# Patient Record
Sex: Female | Born: 1975 | Race: White | Hispanic: No | Marital: Married | Smoking: Never smoker
Health system: Southern US, Community
[De-identification: ages and names within clinical notes are randomized; demographics above are authoritative.]

## PROBLEM LIST (undated history)

## (undated) DIAGNOSIS — D1803 Hemangioma of intra-abdominal structures: Secondary | ICD-10-CM

## (undated) DIAGNOSIS — G43909 Migraine, unspecified, not intractable, without status migrainosus: Secondary | ICD-10-CM

## (undated) DIAGNOSIS — Q0700 Arnold-Chiari syndrome without spina bifida or hydrocephalus: Secondary | ICD-10-CM

## (undated) DIAGNOSIS — I341 Nonrheumatic mitral (valve) prolapse: Secondary | ICD-10-CM

## (undated) DIAGNOSIS — J45909 Unspecified asthma, uncomplicated: Secondary | ICD-10-CM

## (undated) HISTORY — DX: Migraine, unspecified, not intractable, without status migrainosus: G43.909

## (undated) HISTORY — DX: Nonrheumatic mitral (valve) prolapse: I34.1

## (undated) HISTORY — DX: Arnold-Chiari syndrome without spina bifida or hydrocephalus: Q07.00

## (undated) HISTORY — DX: Hemangioma of intra-abdominal structures: D18.03

## (undated) HISTORY — DX: Unspecified asthma, uncomplicated: J45.909

---

## 1998-01-14 ENCOUNTER — Encounter: Admission: RE | Admit: 1998-01-14 | Discharge: 1998-01-14 | Payer: Self-pay | Admitting: *Deleted

## 1998-02-24 ENCOUNTER — Ambulatory Visit (HOSPITAL_COMMUNITY): Admission: RE | Admit: 1998-02-24 | Discharge: 1998-02-24 | Payer: Self-pay | Admitting: *Deleted

## 1998-04-02 ENCOUNTER — Ambulatory Visit (HOSPITAL_COMMUNITY): Admission: RE | Admit: 1998-04-02 | Discharge: 1998-04-02 | Payer: Self-pay | Admitting: *Deleted

## 1998-04-02 ENCOUNTER — Encounter: Payer: Self-pay | Admitting: *Deleted

## 1998-07-03 ENCOUNTER — Inpatient Hospital Stay (HOSPITAL_COMMUNITY): Admission: AD | Admit: 1998-07-03 | Discharge: 1998-07-05 | Payer: Self-pay | Admitting: *Deleted

## 1998-08-13 ENCOUNTER — Inpatient Hospital Stay (HOSPITAL_COMMUNITY): Admission: AD | Admit: 1998-08-13 | Discharge: 1998-08-13 | Payer: Self-pay | Admitting: *Deleted

## 1998-11-11 ENCOUNTER — Inpatient Hospital Stay (HOSPITAL_COMMUNITY): Admission: AD | Admit: 1998-11-11 | Discharge: 1998-11-11 | Payer: Self-pay | Admitting: *Deleted

## 1999-02-05 ENCOUNTER — Inpatient Hospital Stay (HOSPITAL_COMMUNITY): Admission: AD | Admit: 1999-02-05 | Discharge: 1999-02-05 | Payer: Self-pay | Admitting: *Deleted

## 1999-04-30 ENCOUNTER — Inpatient Hospital Stay (HOSPITAL_COMMUNITY): Admission: AD | Admit: 1999-04-30 | Discharge: 1999-04-30 | Payer: Self-pay | Admitting: *Deleted

## 2004-04-28 ENCOUNTER — Emergency Department (HOSPITAL_COMMUNITY): Admission: EM | Admit: 2004-04-28 | Discharge: 2004-04-28 | Payer: Self-pay | Admitting: Family Medicine

## 2004-06-13 ENCOUNTER — Encounter (INDEPENDENT_AMBULATORY_CARE_PROVIDER_SITE_OTHER): Payer: Self-pay | Admitting: Internal Medicine

## 2004-08-10 ENCOUNTER — Ambulatory Visit (HOSPITAL_COMMUNITY): Admission: RE | Admit: 2004-08-10 | Discharge: 2004-08-10 | Payer: Self-pay | Admitting: Internal Medicine

## 2004-08-10 ENCOUNTER — Ambulatory Visit: Payer: Self-pay | Admitting: Internal Medicine

## 2004-08-17 ENCOUNTER — Ambulatory Visit: Payer: Self-pay | Admitting: Internal Medicine

## 2004-08-18 ENCOUNTER — Ambulatory Visit (HOSPITAL_COMMUNITY): Admission: RE | Admit: 2004-08-18 | Discharge: 2004-08-18 | Payer: Self-pay | Admitting: Internal Medicine

## 2004-08-23 ENCOUNTER — Ambulatory Visit: Payer: Self-pay | Admitting: Internal Medicine

## 2005-09-01 ENCOUNTER — Ambulatory Visit: Payer: Self-pay | Admitting: Internal Medicine

## 2005-09-29 ENCOUNTER — Ambulatory Visit: Payer: Self-pay | Admitting: Internal Medicine

## 2005-11-09 ENCOUNTER — Ambulatory Visit (HOSPITAL_COMMUNITY): Admission: RE | Admit: 2005-11-09 | Discharge: 2005-11-09 | Payer: Self-pay | Admitting: Internal Medicine

## 2005-11-11 ENCOUNTER — Ambulatory Visit (HOSPITAL_COMMUNITY): Admission: RE | Admit: 2005-11-11 | Discharge: 2005-11-11 | Payer: Self-pay | Admitting: Internal Medicine

## 2005-11-14 ENCOUNTER — Other Ambulatory Visit: Admission: RE | Admit: 2005-11-14 | Discharge: 2005-11-14 | Payer: Self-pay | Admitting: Gynecology

## 2005-12-15 ENCOUNTER — Ambulatory Visit: Payer: Self-pay | Admitting: Internal Medicine

## 2006-11-21 ENCOUNTER — Ambulatory Visit: Payer: Self-pay | Admitting: Family Medicine

## 2007-01-25 ENCOUNTER — Encounter: Payer: Self-pay | Admitting: Internal Medicine

## 2007-01-25 DIAGNOSIS — R519 Headache, unspecified: Secondary | ICD-10-CM | POA: Insufficient documentation

## 2007-01-25 DIAGNOSIS — Q054 Unspecified spina bifida with hydrocephalus: Secondary | ICD-10-CM | POA: Insufficient documentation

## 2007-01-25 DIAGNOSIS — R51 Headache: Secondary | ICD-10-CM

## 2007-02-12 DIAGNOSIS — F411 Generalized anxiety disorder: Secondary | ICD-10-CM | POA: Insufficient documentation

## 2007-03-05 ENCOUNTER — Encounter (INDEPENDENT_AMBULATORY_CARE_PROVIDER_SITE_OTHER): Payer: Self-pay | Admitting: Family Medicine

## 2007-03-05 ENCOUNTER — Ambulatory Visit: Payer: Self-pay | Admitting: Family Medicine

## 2007-03-05 DIAGNOSIS — R8789 Other abnormal findings in specimens from female genital organs: Secondary | ICD-10-CM | POA: Insufficient documentation

## 2007-03-05 LAB — CONVERTED CEMR LAB
ALT: 13 units/L (ref 0–35)
Alkaline Phosphatase: 56 units/L (ref 39–117)
CO2: 24 meq/L (ref 19–32)
Calcium: 8.9 mg/dL (ref 8.4–10.5)
Cholesterol: 158 mg/dL (ref 0–200)
Creatinine, Ser: 0.69 mg/dL (ref 0.40–1.20)
Potassium: 4.3 meq/L (ref 3.5–5.3)
Total Bilirubin: 0.3 mg/dL (ref 0.3–1.2)
Triglycerides: 140 mg/dL (ref <150)

## 2007-05-23 ENCOUNTER — Other Ambulatory Visit: Admission: RE | Admit: 2007-05-23 | Discharge: 2007-05-23 | Payer: Self-pay | Admitting: Obstetrics and Gynecology

## 2007-05-23 ENCOUNTER — Ambulatory Visit: Payer: Self-pay | Admitting: Gynecology

## 2007-05-23 ENCOUNTER — Encounter (INDEPENDENT_AMBULATORY_CARE_PROVIDER_SITE_OTHER): Payer: Self-pay | Admitting: Family Medicine

## 2007-06-22 ENCOUNTER — Ambulatory Visit: Payer: Self-pay | Admitting: Obstetrics & Gynecology

## 2008-03-05 ENCOUNTER — Ambulatory Visit: Payer: Self-pay | Admitting: Family Medicine

## 2008-03-27 ENCOUNTER — Telehealth (INDEPENDENT_AMBULATORY_CARE_PROVIDER_SITE_OTHER): Payer: Self-pay | Admitting: Family Medicine

## 2008-04-15 ENCOUNTER — Encounter (INDEPENDENT_AMBULATORY_CARE_PROVIDER_SITE_OTHER): Payer: Self-pay | Admitting: *Deleted

## 2008-09-06 ENCOUNTER — Inpatient Hospital Stay (HOSPITAL_COMMUNITY): Admission: AD | Admit: 2008-09-06 | Discharge: 2008-09-06 | Payer: Self-pay | Admitting: Obstetrics and Gynecology

## 2008-10-24 ENCOUNTER — Inpatient Hospital Stay (HOSPITAL_COMMUNITY): Admission: RE | Admit: 2008-10-24 | Discharge: 2008-10-26 | Payer: Self-pay | Admitting: Obstetrics and Gynecology

## 2008-10-28 ENCOUNTER — Inpatient Hospital Stay (HOSPITAL_COMMUNITY): Admission: AD | Admit: 2008-10-28 | Discharge: 2008-10-29 | Payer: Self-pay | Admitting: Obstetrics and Gynecology

## 2009-07-13 ENCOUNTER — Emergency Department (HOSPITAL_COMMUNITY): Admission: EM | Admit: 2009-07-13 | Discharge: 2009-07-13 | Payer: Self-pay | Admitting: Family Medicine

## 2009-08-18 ENCOUNTER — Ambulatory Visit: Payer: Self-pay | Admitting: Nurse Practitioner

## 2009-08-18 DIAGNOSIS — N63 Unspecified lump in unspecified breast: Secondary | ICD-10-CM

## 2009-08-20 ENCOUNTER — Encounter: Admission: RE | Admit: 2009-08-20 | Discharge: 2009-08-20 | Payer: Self-pay | Admitting: Internal Medicine

## 2010-06-13 DIAGNOSIS — I341 Nonrheumatic mitral (valve) prolapse: Secondary | ICD-10-CM

## 2010-06-13 HISTORY — DX: Nonrheumatic mitral (valve) prolapse: I34.1

## 2010-06-15 ENCOUNTER — Emergency Department (HOSPITAL_COMMUNITY)
Admission: EM | Admit: 2010-06-15 | Discharge: 2010-06-15 | Payer: Self-pay | Source: Home / Self Care | Admitting: Family Medicine

## 2010-07-13 NOTE — Assessment & Plan Note (Signed)
Summary: Asthma/Breast Lump   Vital Signs:  Patient profile:   35 year old female Menstrual status:  regular LMP:     02/11 Weight:      144.7 pounds BMI:     21.14 O2 Sat:      99 % on Room air Temp:     98 degrees F oral Pulse rate:   86 / minute Pulse rhythm:   regular Resp:     24 per minute BP sitting:   118 / 84  (left arm) Cuff size:   regular  Vitals Entered By: Gaylyn Cheers RN (August 18, 2009 12:47 PM)  O2 Flow:  Room air CC: lump lt breast x's 6 months, SOB when lying down with cough, expectorating lg amt. cl mucous Is Patient Diabetic? No Pain Assessment Patient in pain? no       Does patient need assistance? Functional Status Self care Ambulation Normal Comments takes multi vit. and albuterol MDI prn LMP (date): 02/11 LMP - Character: light Menarche (age onset years): 14   Menses interval (days): ? Menstrual flow (days): 4 Menstrual Status regular Enter LMP: 02/11 Last PAP Result Done   Serial Vital Signs/Assessments:                                PEF    PreRx  PostRx Time      O2 Sat  O2 Type     L/min  L/min  L/min   By 1:00 PM   99  %   Room air                          [facility] RN  Comments: peak flow 230, 220, 270 By: Gaylyn Cheers RN    CC:  lump lt breast x's 6 months, SOB when lying down with cough, and expectorating lg amt. cl mucous.  History of Present Illness:  Pt into the office for f/u for breast problems Noticed 6 months ago. Area is not getting larger some discomfort and tenderness Normal nipple. no discharge.  breast fed baby for first month.  Baby is now 28 months old Normal menses every month Denies regular caffiene   Maternal grandmother - breast cancer and several maternal aunts with breast cancer mother is not affected.  Pt has no sisters  Asthma -  cough and wheezing mostly at night when she lays down. Seen in urgent care for bronchitis in feb 2011   Habits & Providers  Alcohol-Tobacco-Diet  Alcohol drinks/day: 0     Tobacco Status: never     Passive Smoke Exposure: no  Exercise-Depression-Behavior     Does Patient Exercise: no     Drug Use: no     Seat Belt Use: 100     Sun Exposure: occasionally  Allergies (verified): No Known Drug Allergies  Review of Systems General:  Denies fever. CV:  Denies chest pain or discomfort. Resp:  Complains of shortness of breath and wheezing; denies cough; usually at night. GI:  Denies abdominal pain, nausea, and vomiting.  Physical Exam  General:  alert.   Head:  normocephalic.   Breasts:  left - upper outer quadrant, tender mobile mass nipple everted right - no masses palpated Lungs:  normal breath sounds.   Heart:  normal rate and regular rhythm.     Impression & Recommendations:  Problem # 1:  LUMP OR MASS IN  BREAST (ICD-611.72) will order mammogram Orders: Mammogram (Diagnostic) (Mammo)  Problem # 2:  ASTHMA (ICD-493.90)  Her updated medication list for this problem includes:    Singulair 10 Mg Tabs (Montelukast sodium) ..... One tablet by mouth nightly for breathing    Ventolin Hfa 108 (90 Base) Mcg/act Aers (Albuterol sulfate) .Marland Kitchen... 2 puffs every 6 hours as needed for shortness of breath  Orders: Pulse Oximetry (single measurment) (94760) Peak Flow Meter (Z6109)  Complete Medication List: 1)  Singulair 10 Mg Tabs (Montelukast sodium) .... One tablet by mouth nightly for breathing 2)  Ventolin Hfa 108 (90 Base) Mcg/act Aers (Albuterol sulfate) .... 2 puffs every 6 hours as needed for shortness of breath  Patient Instructions: 1)  Keep your appointment for mammogram 2)  Breathing - take singulair 10mg  by mouth nightly (prescription has been faxed to the pharmacy) 3)  Schedule an appointment for a complete physical exam. Prescriptions: VENTOLIN HFA 108 (90 BASE) MCG/ACT AERS (ALBUTEROL SULFATE) 2 puffs every 6 hours as needed for shortness of breath  #1 x 3   Entered and Authorized by:   Lehman Prom FNP    Signed by:   Lehman Prom FNP on 08/18/2009   Method used:   Faxed to ...       [facility] (retail)       [address] [place], [place]  [address]       Ph: [phone]       Fax: [phone]   RxID:   [id number] SINGULAIR 10 MG TABS (MONTELUKAST SODIUM) One tablet by mouth nightly for breathing  #30 x 5   Entered and Authorized by:   Lehman Prom FNP   Signed by:   Lehman Prom FNP on 08/18/2009   Method used:   Faxed to ...       [facility] (retail)       [address] [place], [place]  [address]       Ph: [phone]       Fax: [phone]   RxID:   [id number]

## 2010-09-21 LAB — DIFFERENTIAL
Basophils Absolute: 0 10*3/uL (ref 0.0–0.1)
Basophils Absolute: 0.1 10*3/uL (ref 0.0–0.1)
Basophils Relative: 0 % (ref 0–1)
Eosinophils Absolute: 0.9 10*3/uL — ABNORMAL HIGH (ref 0.0–0.7)
Eosinophils Absolute: 1.1 10*3/uL — ABNORMAL HIGH (ref 0.0–0.7)
Eosinophils Relative: 8 % — ABNORMAL HIGH (ref 0–5)
Lymphs Abs: 1.6 10*3/uL (ref 0.7–4.0)
Monocytes Absolute: 0.9 10*3/uL (ref 0.1–1.0)
Monocytes Absolute: 1.3 10*3/uL — ABNORMAL HIGH (ref 0.1–1.0)
Monocytes Relative: 7 % (ref 3–12)
Neutro Abs: 7.7 10*3/uL (ref 1.7–7.7)
Neutrophils Relative %: 75 % (ref 43–77)

## 2010-09-21 LAB — CBC
HCT: 31.4 % — ABNORMAL LOW (ref 36.0–46.0)
HCT: 33.3 % — ABNORMAL LOW (ref 36.0–46.0)
Hemoglobin: 11.5 g/dL — ABNORMAL LOW (ref 12.0–15.0)
Hemoglobin: 10.8 g/dL — ABNORMAL LOW (ref 12.0–15.0)
MCHC: 34.5 g/dL (ref 30.0–36.0)
MCHC: 35.9 g/dL (ref 30.0–36.0)
MCV: 89.9 fL (ref 78.0–100.0)
MCV: 89.2 fL (ref 78.0–100.0)
Platelets: 222 10*3/uL (ref 150–400)
Platelets: 222 10*3/uL (ref 150–400)
RBC: 3.25 MIL/uL — ABNORMAL LOW (ref 3.87–5.11)
RBC: 3.93 MIL/uL (ref 3.87–5.11)
RDW: 14.4 % (ref 11.5–15.5)
RDW: 14.2 % (ref 11.5–15.5)
WBC: 11.2 10*3/uL — ABNORMAL HIGH (ref 4.0–10.5)
WBC: 11.2 10*3/uL — ABNORMAL HIGH (ref 4.0–10.5)

## 2010-09-21 LAB — URINALYSIS, ROUTINE W REFLEX MICROSCOPIC
Glucose, UA: NEGATIVE mg/dL
Ketones, ur: NEGATIVE mg/dL
Nitrite: NEGATIVE
pH: 6 (ref 5.0–8.0)

## 2010-09-21 LAB — URINE MICROSCOPIC-ADD ON

## 2010-09-21 LAB — LACTATE DEHYDROGENASE: LDH: 253 U/L — ABNORMAL HIGH (ref 94–250)

## 2010-09-23 LAB — WET PREP, GENITAL: Yeast Wet Prep HPF POC: NONE SEEN

## 2010-09-23 LAB — URINALYSIS, ROUTINE W REFLEX MICROSCOPIC
Bilirubin Urine: NEGATIVE
Urobilinogen, UA: 0.2 mg/dL (ref 0.0–1.0)

## 2010-09-23 LAB — RAPID STREP SCREEN (MED CTR MEBANE ONLY): Streptococcus, Group A Screen (Direct): NEGATIVE

## 2010-09-23 LAB — URINE MICROSCOPIC-ADD ON

## 2010-10-26 NOTE — H&P (Signed)
Gwendolyn Mitchell, Gwendolyn Mitchell NO.:  [id number]   MEDICAL RECORD NO.:  [id number]          PATIENT TYPE:  INP   LOCATION:  9174                          FACILITY:  [facility]   PHYSICIAN:  Hal Morales, M.D.DATE OF BIRTH:  07-25-1975   DATE OF ADMISSION:  10/24/2008  DATE OF DISCHARGE:                              HISTORY & PHYSICAL   Gwendolyn Mitchell is a 35 year old gravida 3, para 2-0-0-2 at 40-4/7 weeks, who  presents for induction secondary to post dates.  Cervix has been 2-3 cm  on office exam.  The patient denies uterine contractions.  She reports  positive fetal movement.  Pregnancy has been remarkable for;  1. Previous vacuum delivery with her last pregnancy.  2. Group B strep negative.  3. First trimester spotting.   PRENATAL LABS:  Blood type is B+, Rh antibody negative, VDRL  nonreactive, rubella titer positive, hepatitis B surface antigen  negative, HIV is nonreactive.  GC and chlamydia cultures were negative  in the first trimester.  Pap was normal in November 2009.  Hemoglobin  upon entering the practice was 12, it was 11.8 at 26 weeks.  She  presented to care too late for first trimester screen, but she had a  normal quadruple screen.  One-hour Glucola was normal.  Group B strep  culture was negative at 36 weeks.   HISTORY OF PRESENT PREGNANCY:  The patient entered care at approximately  16 weeks.  She had a quadruple screen done that was normal.  She  declined H1N1 vaccine.  At 18 weeks, she had a normal ultrasound showing  a posterior placenta and normal cervical length.  She had a 1-hour  Glucola that was normal.  She did have some spotting in March and was  seen at maternity admissions, but no significant findings were noted.  Group B strep culture was negative at 36 weeks.  The patient was seen in  the office this week and was found to be 2-3 cm and was scheduled for  induction for post dates.   OBSTETRICAL HISTORY:  In 1997, she had a vaginal birth of a  female  infant, weight 6 pounds 11 ounces at 40 weeks.  She had no anesthesia.  She was on iron with her first pregnancy.  In 2000, she had a vacuum-  assisted vaginal delivery of a female infant, weight 8 pounds 9 ounces  at [redacted] weeks gestation.  She was in labor 5-6 hours.  She also had no  anesthesia with that labor.  She did have chicken pox during her first  pregnancy.   MEDICAL HISTORY:  She was on Depo-Provera in approximately 1998.  She  had one abnormal Pap in the past and had a colposcopy and a biopsy with  a follow-up normal Pap in January 2008.  She has been anemic previously.  Her only other surgery was wisdom teeth at age 50.  She has been on  albuterol inhaler for restrictive airway symptoms during this pregnancy  though the patient denies a history of asthma.   ALLERGIES:  NO KNOWN MEDICATION ALLERGIES.  FAMILY HISTORY:  Her father and brother have chronic hypertension.  Her  maternal grandmother, father and brother have hypertension.  Her brother  has rheumatoid arthritis.  Her father had skin cancer.  Her dad has  anxiety and depression.   GENETIC HISTORY:  Unremarkable.   SOCIAL HISTORY:  The patient is married to the father of the baby.  He  is involved and supportive, his name is Gwendolyn Mitchell.  The patient is  Caucasian.  She denies any religious affiliation.  She has 2 years of  college.  She is a Futures trader.  Her husband has a master's degree.  He is  a Runner, broadcasting/film/video of the martial arts.  She has been followed by the physician  service at [facility].  She denies any alcohol, drug or tobacco  use during this pregnancy.   PHYSICAL EXAMINATION:  VITAL SIGNS:  Stable.  The patient is afebrile.  HEENT:  Within normal limits.  LUNGS:  Breath sounds are clear.  HEART:  Regular rate and rhythm without murmur.  BREASTS:  Soft and nontender.  ABDOMEN:  Fundal height is approximately 39 cm.  Estimated fetal weight  is 8 pounds.  Uterine contractions are irregular and  mild.  Cervical  exam on previous visit in the office was 2-3 cm with vertex  presentation.  Her vaginal exam is deferred at this time.  Fetal heart  rate is reactive with no decelerations.  EXTREMITIES:  Deep tendon  reflexes are 2+ without clonus.  There is trace edema noted.   IMPRESSION:  1. Intrauterine pregnancy at 40-4/7 weeks.  2. Post dates with favorable cervix.  3. Group B strep negative.   PLAN:  1. Admit to birthing suite with consult Dr. Pennie Rushing as attending      physician.  2. Routine physician orders.  3. Dr. Pennie Rushing plans artificial rupture of membranes and ambulation      for the patient.  Pitocin will be used p.r.n. based on labor      status.  4. Pain med p.r.n., although the patient this time declines any need      for pain medication.      Gwendolyn Mitchell, C.N.M.      Hal Morales, M.D.  Electronically Signed    VLL/MEDQ  D:  10/24/2008  T:  10/24/2008  Job:  [id number]

## 2011-01-27 LAB — HM PAP SMEAR: HM Pap smear: NORMAL

## 2011-03-15 IMAGING — CR DG CHEST 2V
2 series · 2 of 2 positions shown · non-contrast
Comparison: 10/25/2008

CLINICAL DATA: Status post vaginal delivery of child on 10/24/2008.
Shortness of breath and history of asthma.

CHEST - 2 VIEW

[view not recorded (1 of 2)]
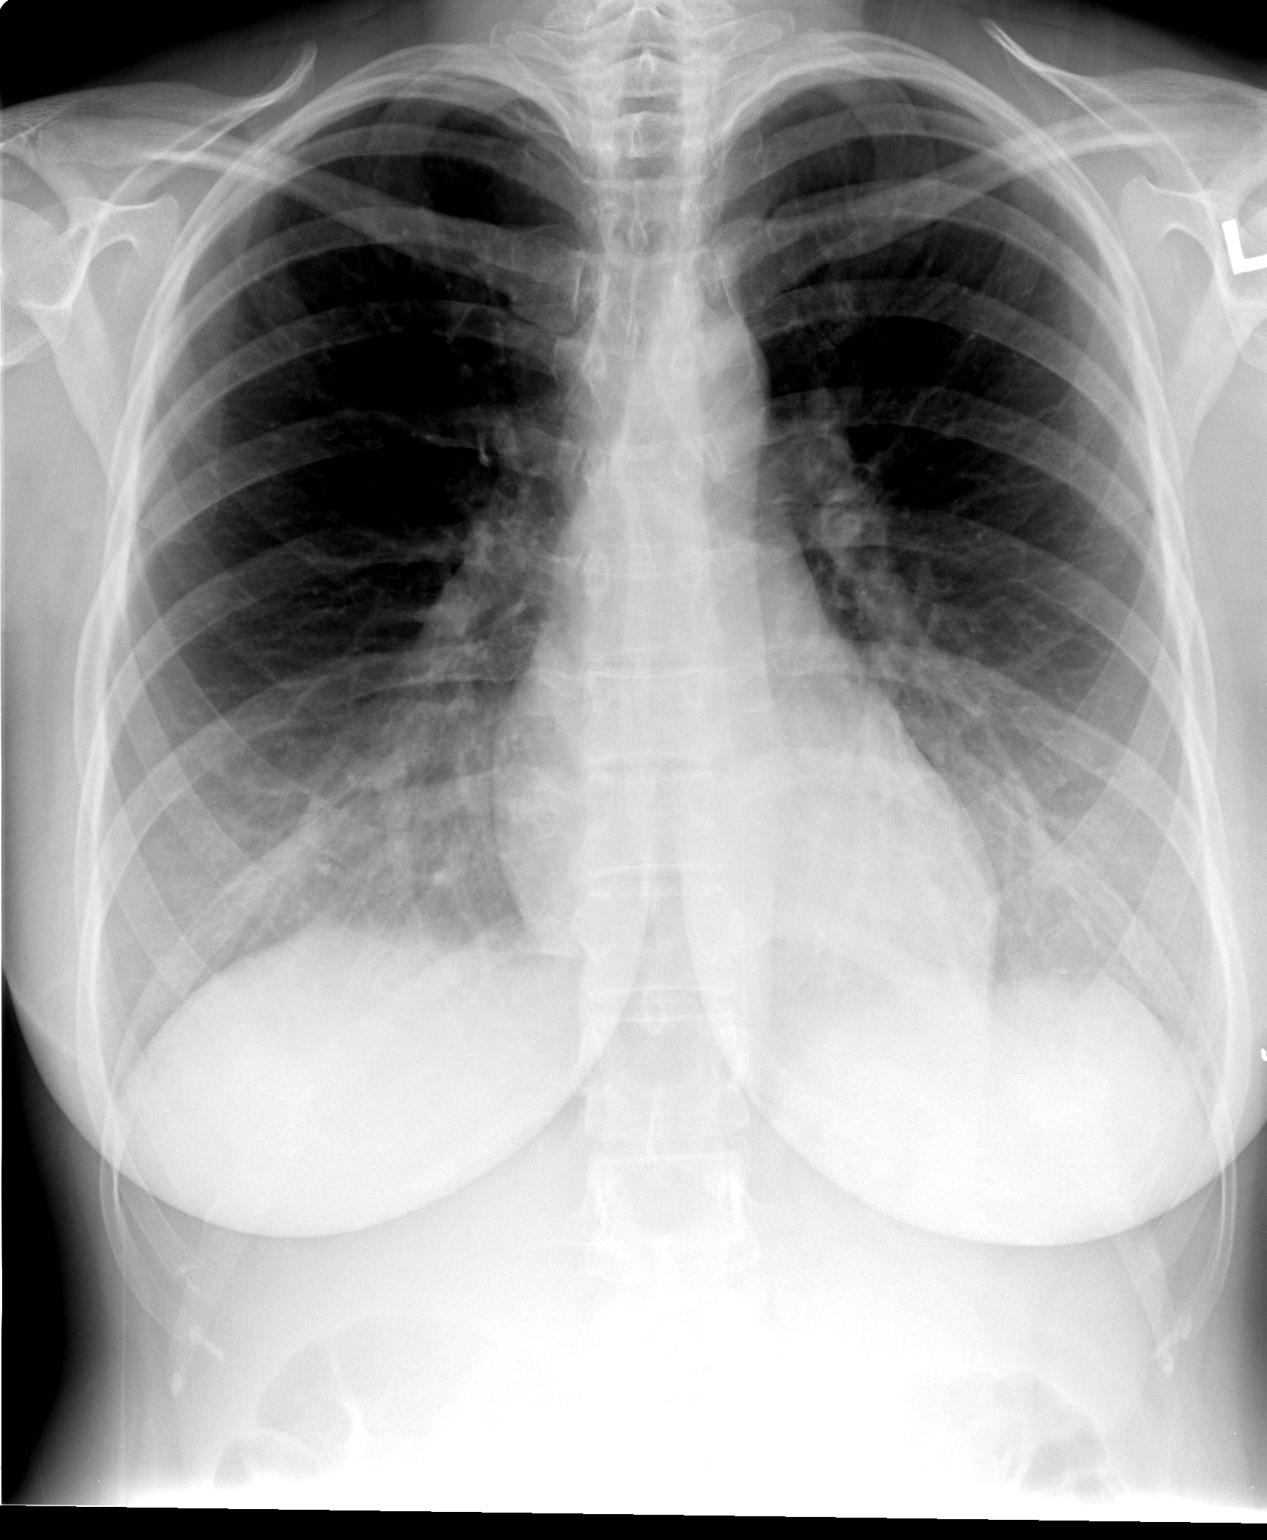

[view not recorded (2 of 2)]
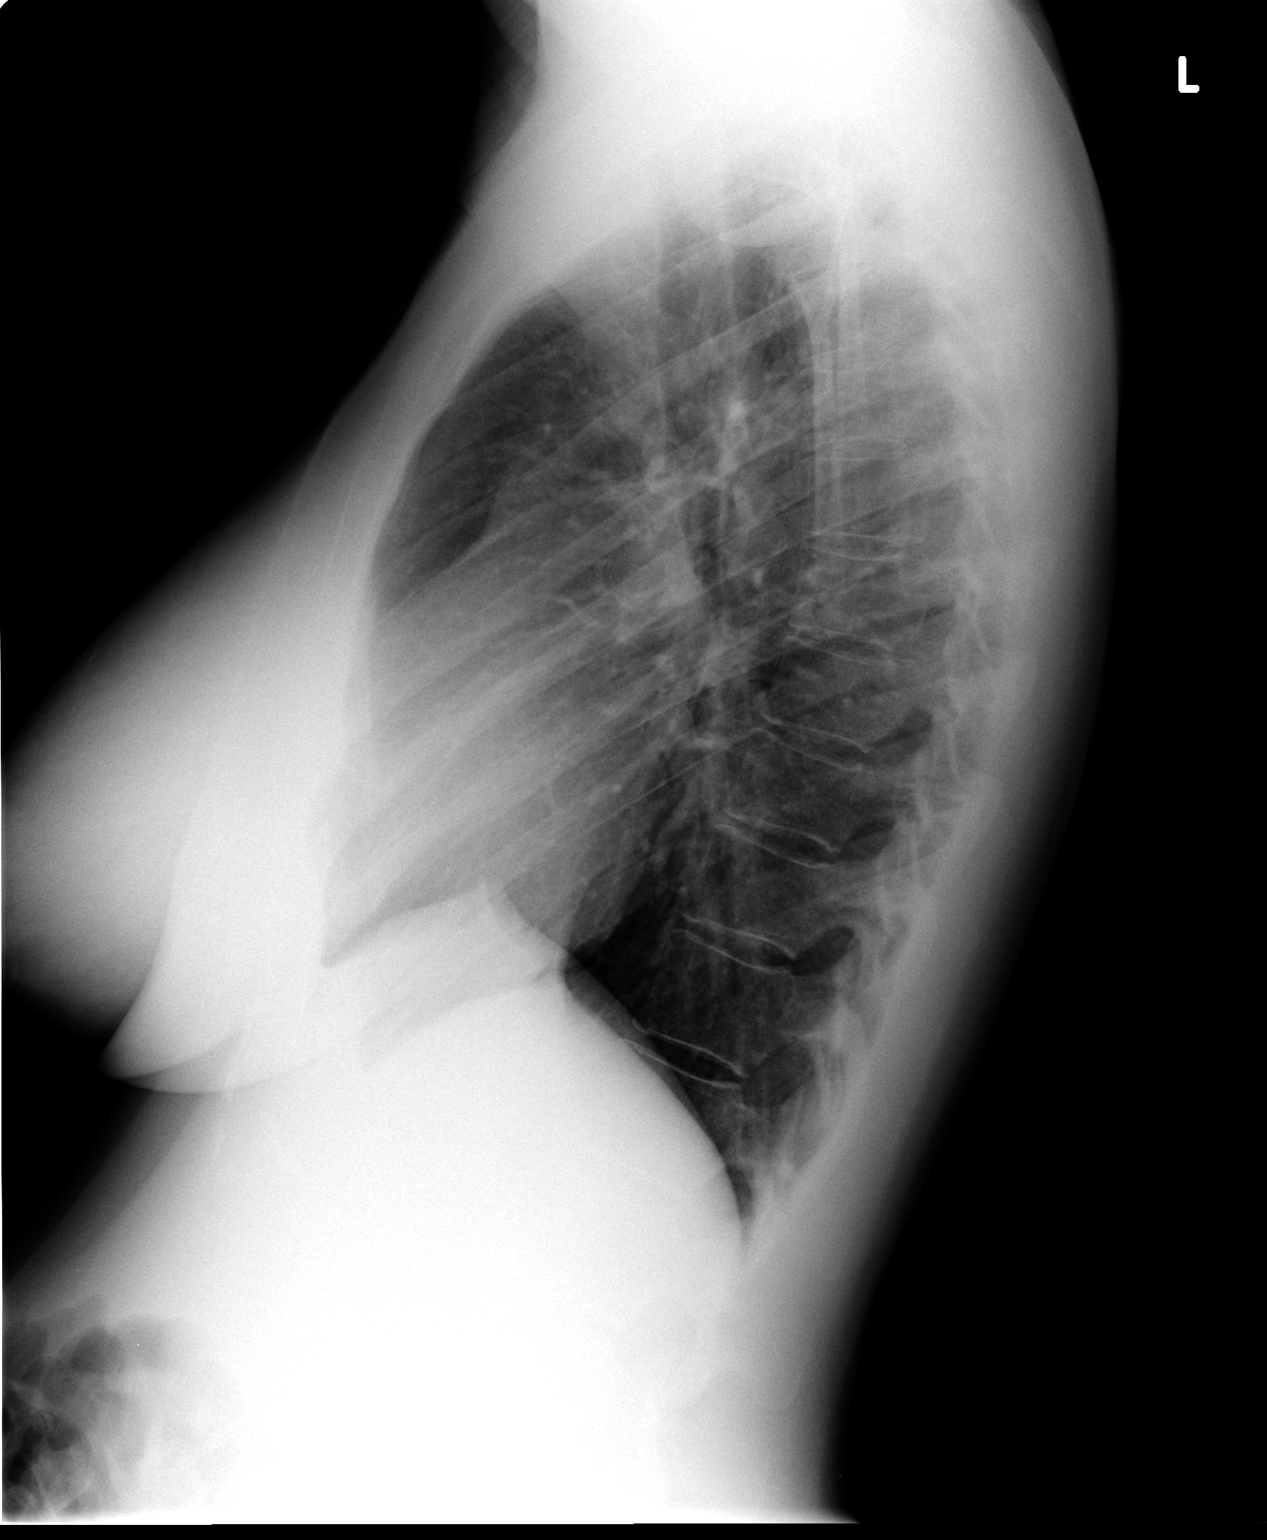

[2 of 2 positions shown; findings below may reference images not displayed]

FINDINGS: Stable appearance of chest with mild bronchial thickening
but no infiltrate, edema or pleural effusion.  Heart size is
normal.
IMPRESSION: Stable appearance of chest with mild bronchial thickening present.

## 2011-03-21 LAB — POCT PREGNANCY, URINE
Operator id: 134861
Preg Test, Ur: NEGATIVE

## 2011-07-18 ENCOUNTER — Other Ambulatory Visit: Payer: Self-pay | Admitting: Family Medicine

## 2011-07-18 ENCOUNTER — Ambulatory Visit
Admission: RE | Admit: 2011-07-18 | Discharge: 2011-07-18 | Disposition: A | Discharge status: Home / Self Care | Payer: Medicaid Other | Source: Ambulatory Visit | Attending: Family Medicine | Admitting: Family Medicine

## 2011-07-18 DIAGNOSIS — M542 Cervicalgia: Secondary | ICD-10-CM

## 2012-09-18 ENCOUNTER — Ambulatory Visit (INDEPENDENT_AMBULATORY_CARE_PROVIDER_SITE_OTHER): Payer: 59 | Admitting: Family Medicine

## 2012-09-18 ENCOUNTER — Encounter: Payer: Self-pay | Admitting: Family Medicine

## 2012-09-18 VITALS — BP 120/78 | HR 82 | Temp 98.4°F | Resp 16 | Wt 160.0 lb

## 2012-09-18 DIAGNOSIS — B9789 Other viral agents as the cause of diseases classified elsewhere: Secondary | ICD-10-CM

## 2012-09-18 DIAGNOSIS — R509 Fever, unspecified: Secondary | ICD-10-CM

## 2012-09-18 DIAGNOSIS — B349 Viral infection, unspecified: Secondary | ICD-10-CM

## 2012-09-18 LAB — INFLUENZA A AND B: Inflenza A Ag: NEGATIVE

## 2012-09-18 NOTE — Progress Notes (Signed)
  Subjective:    Patient ID: Gwendolyn Mitchell, female    DOB: 1975/08/03, 37 y.o.   MRN: [id number]  HPI  Began 3 days ago with fever and generalized malaise. Reports a dull occipital headache, some mild tenderness in the neck, and diffuse myalgias. Denies rhinorrhea, cough, sore throat, nausea vomiting or diarrhea. Has no rash.  Her daughter recently recovered from a flulike illness 2 days prior to her developing symptoms.    Patient has past medical history of asthma, Arnold-Chiari malformation, and had normal Pap smears. Currently taking no medications. Has no known drug allergies      Review of Systems  Constitutional: Positive for fever, activity change and fatigue.  HENT: Positive for neck pain. Negative for ear pain, nosebleeds, congestion, rhinorrhea, sneezing, neck stiffness and tinnitus.   Respiratory: Negative for cough, shortness of breath and wheezing.   Cardiovascular: Negative.   Gastrointestinal: Negative.   Musculoskeletal: Positive for myalgias and arthralgias.       Objective:   Physical Exam  Constitutional: She is oriented to person, place, and time. She appears well-developed and well-nourished.  HENT:  Head: Normocephalic and atraumatic.  Right Ear: External ear normal.  Left Ear: External ear normal.  Mouth/Throat: Oropharynx is clear and moist.  Eyes: Conjunctivae and EOM are normal. Pupils are equal, round, and reactive to light.  Neck: Normal range of motion. Neck supple.  Cardiovascular: Normal rate, regular rhythm and normal heart sounds.   No murmur heard. Pulmonary/Chest: Effort normal and breath sounds normal. No respiratory distress. She has no wheezes. She has no rales.  Abdominal: Soft. Bowel sounds are normal. She exhibits no distension. There is no tenderness. There is no rebound.  Lymphadenopathy:    She has no cervical adenopathy.  Neurological: She is alert and oriented to person, place, and time. She has normal reflexes. She displays normal  reflexes. No cranial nerve deficit. She exhibits normal muscle tone. Coordination normal.  Skin: Skin is warm and dry. No rash noted. No erythema. No pallor.   no meningismus        Assessment & Plan:  Suspect viral syndrome. Check flu test. Test is negative. I do not think this is a tick borne illness. We'll treat supportively with Tylenol and Motrin and watch for the next 48 hours. Patient to call me immediately if symptoms worsen.

## 2012-09-19 ENCOUNTER — Telehealth: Payer: Self-pay | Admitting: Family Medicine

## 2012-09-19 MED ORDER — OSELTAMIVIR PHOSPHATE 75 MG PO CAPS: ORAL_CAPSULE | ORAL | Status: DC

## 2012-09-19 NOTE — Telephone Encounter (Signed)
tamiflu 75 bid, x 5 days please call in

## 2012-09-19 NOTE — Telephone Encounter (Signed)
rx done pt aware

## 2012-10-31 ENCOUNTER — Encounter: Payer: Self-pay | Admitting: Physician Assistant

## 2012-10-31 ENCOUNTER — Ambulatory Visit (INDEPENDENT_AMBULATORY_CARE_PROVIDER_SITE_OTHER): Payer: 59 | Admitting: Physician Assistant

## 2012-10-31 VITALS — BP 114/80 | HR 104 | Temp 98.3°F | Resp 20 | Ht 68.25 in | Wt 151.0 lb

## 2012-10-31 DIAGNOSIS — J45909 Unspecified asthma, uncomplicated: Secondary | ICD-10-CM

## 2012-10-31 MED ORDER — PREDNISONE 20 MG PO TABS: ORAL_TABLET | ORAL | Status: DC

## 2012-10-31 NOTE — Progress Notes (Signed)
   Patient ID: Gwendolyn Mitchell MRN: [id number], DOB: 11-12-1975, 38 y.o. Date of Encounter: 10/31/2012, 12:41 PM    Chief Complaint:  Chief Complaint  Patient presents with  . asthma flare up     HPI: 37 y.o. year old female here for eval of asthma. Says it has been >1 1/2 years since last flare. She is on no routine daily med for asthma. Just has Albuterol Neb and Inh to use prn. Has not needed med at all until yesterday. Yesterday used Inhaler 4 times during the day. Did 2 Neb treatments in the afternoon. Today did Neb this morning early and has used Inh multiple times since then. Just used Inhaler 30 minutes ago.  Says her daughter had "cold" symtoms last week for several days then it resolved without antibiotics. Pt says she caught daughter's cold-she developed cough 2-3 days ago. No mucus from nose, no sore thraot, no fever.   Home Meds: See attached medication section for any medications that were entered at today's visit. The computer does not put those onto this list.The following list is a list of meds entered prior to today's visit.   No current outpatient prescriptions on file prior to visit.   No current facility-administered medications on file prior to visit.    Allergies: No Known Allergies    Review of Systems: See HPI for pertinent ROS. All other ROS negative.    Physical Exam: Blood pressure 114/80, pulse 104, temperature 98.3 F (36.8 C), temperature source Oral, resp. rate 20, height 5' 8.25" (1.734 m), weight 151 lb (68.493 kg), last menstrual period 10/17/2012., Body mass index is 22.78 kg/(m^2). General: WNWD WF. Appears in no acute distress. HEENT: Normocephalic, atraumatic, eyes without discharge, sclera non-icteric, nares are without discharge. Bilateral auditory canals clear, TM's are without perforation, pearly grey and translucent with reflective cone of light bilaterally. Oral cavity moist, posterior pharynx without exudate, erythema, peritonsillar abscess, or  post nasal drip.  Neck: Supple. No thyromegaly. No lymphadenopathy. Lungs: Breathing is unlabored. Very slight wheezes scattered throughout.  Heart: Regular rhythm. No murmurs, rubs, or gallops. Msk:  Strength and tone normal for age. Extremities/Skin: Warm and dry. No clubbing or cyanosis. No edema. No rashes or suspicious lesions. Neuro: Alert and oriented X 3. Moves all extremities spontaneously. Gait is normal. CNII-XII grossly in tact. Psych:  Responds to questions appropriately with a normal affect.     ASSESSMENT AND PLAN:  37 y.o. year old female with  1. ASTHMA She has very minimal wheezes on exam now but she has used lots of Albuterol and last treatment just 30 minutes ago. Therefore, will go ahead and use Prednisone.  - predniSONE (DELTASONE) 20 MG tablet; Take 3 daily for 2 days, then 2 daily for 2 days, then 1 daily for 2 days.  Dispense: 12 tablet; Refill: 0  If worsens or does not resolve, f/u.  If cough with phlegm does not resolve in one week, f/u.  [address] [place], [place], [facility] 10/31/2012 12:41 PM

## 2013-03-08 ENCOUNTER — Ambulatory Visit (INDEPENDENT_AMBULATORY_CARE_PROVIDER_SITE_OTHER): Payer: 59 | Admitting: Family Medicine

## 2013-03-08 ENCOUNTER — Encounter: Payer: Self-pay | Admitting: Family Medicine

## 2013-03-08 VITALS — BP 108/72 | HR 76 | Temp 98.4°F | Resp 18 | Ht 68.5 in | Wt 153.0 lb

## 2013-03-08 DIAGNOSIS — R0781 Pleurodynia: Secondary | ICD-10-CM

## 2013-03-08 DIAGNOSIS — M2550 Pain in unspecified joint: Secondary | ICD-10-CM

## 2013-03-08 DIAGNOSIS — R079 Chest pain, unspecified: Secondary | ICD-10-CM

## 2013-03-08 DIAGNOSIS — R5381 Other malaise: Secondary | ICD-10-CM

## 2013-03-08 LAB — CBC WITH DIFFERENTIAL/PLATELET
Basophils Absolute: 0.1 10*3/uL (ref 0.0–0.1)
Basophils Relative: 1 % (ref 0–1)
Eosinophils Absolute: 0.4 10*3/uL (ref 0.0–0.7)
Eosinophils Relative: 5 % (ref 0–5)
HCT: 38.6 % (ref 36.0–46.0)
Lymphocytes Relative: 25 % (ref 12–46)
MCH: 28.6 pg (ref 26.0–34.0)
MCHC: 34.2 g/dL (ref 30.0–36.0)
MCV: 83.5 fL (ref 78.0–100.0)
Monocytes Absolute: 0.6 10*3/uL (ref 0.1–1.0)
RDW: 12.9 % (ref 11.5–15.5)

## 2013-03-08 NOTE — Patient Instructions (Signed)
We will call with results  Schedule CPE with PAP Smear

## 2013-03-09 ENCOUNTER — Encounter: Payer: Self-pay | Admitting: Family Medicine

## 2013-03-09 DIAGNOSIS — R0781 Pleurodynia: Secondary | ICD-10-CM | POA: Insufficient documentation

## 2013-03-09 DIAGNOSIS — R5381 Other malaise: Secondary | ICD-10-CM | POA: Insufficient documentation

## 2013-03-09 DIAGNOSIS — M2550 Pain in unspecified joint: Secondary | ICD-10-CM | POA: Insufficient documentation

## 2013-03-09 DIAGNOSIS — R0789 Other chest pain: Secondary | ICD-10-CM | POA: Insufficient documentation

## 2013-03-09 LAB — COMPREHENSIVE METABOLIC PANEL
ALT: 8 U/L (ref 0–35)
AST: 13 U/L (ref 0–37)
Albumin: 4.5 g/dL (ref 3.5–5.2)
Alkaline Phosphatase: 43 U/L (ref 39–117)
Calcium: 9.2 mg/dL (ref 8.4–10.5)
Chloride: 101 mEq/L (ref 96–112)
Potassium: 3.9 mEq/L (ref 3.5–5.3)
Sodium: 137 mEq/L (ref 135–145)
Total Protein: 7.1 g/dL (ref 6.0–8.3)

## 2013-03-09 NOTE — Assessment & Plan Note (Signed)
Exam normal , no tenderness on exam, not classic of costochondritis either,  Consider xray as same pain for > 1 year Labs first

## 2013-03-09 NOTE — Progress Notes (Signed)
  Subjective:    Patient ID: Gwendolyn Mitchell, female    DOB: Feb 16, 1976, 37 y.o.   MRN: [id number]  HPI  Pt here with fatigue, joint pain, and rib pain for past year. No injury to ribs , side or back. Has tenderness in one area only. No recent illness. Joint pains and stiffiness in both hands and in her feet. Her brother has RA, her mother has a rheumatoid disease ? Lupus recently diagnosed. No current medications with exception of vitamins.    Review of Systems - per above  GEN- denies fatigue, fever, weight loss,weakness, recent illness HEENT- denies eye drainage, change in vision, nasal discharge, CVS- denies chest pain, palpitations RESP- denies SOB, cough, wheeze ABD- denies N/V, change in stools, abd pain GU- denies dysuria, hematuria, dribbling, incontinence MSK-+ joint pain, muscle aches, injury Neuro- denies headache, dizziness, syncope, seizure activity       Objective:   Physical Exam GEN- NAD, alert and oriented x3 HEENT- PERRL, EOMI, non injected sclera, pink conjunctiva, MMM, oropharynx clear Neck- Supple, no thyromegaly CVS- RRR, no murmur RESP-CTAB ABD-NABS,soft,NT,ND MSK- Ribs no TTP, Spine NT, neg SLR, FROM upper and LE,  NEURO-CNII-XII in tact, no deficits, normal grasp, normal tone, DTR symmetric EXT- No edema Pulses- Radial, DP- 2+        Assessment & Plan:

## 2013-03-09 NOTE — Assessment & Plan Note (Signed)
Normal exam with family history , check ANA, ESR, RF

## 2013-03-09 NOTE — Assessment & Plan Note (Signed)
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

## 2013-03-13 ENCOUNTER — Encounter: Payer: Self-pay | Admitting: Family Medicine

## 2013-03-13 DIAGNOSIS — I341 Nonrheumatic mitral (valve) prolapse: Secondary | ICD-10-CM

## 2013-03-18 ENCOUNTER — Other Ambulatory Visit: Payer: 59 | Admitting: Physician Assistant

## 2013-04-10 ENCOUNTER — Other Ambulatory Visit: Payer: 59 | Admitting: Physician Assistant

## 2013-04-18 ENCOUNTER — Other Ambulatory Visit: Payer: Self-pay

## 2013-06-10 ENCOUNTER — Encounter: Payer: Self-pay | Admitting: Family Medicine

## 2013-06-10 ENCOUNTER — Telehealth: Payer: Self-pay | Admitting: Family Medicine

## 2013-06-10 MED ORDER — OSELTAMIVIR PHOSPHATE 75 MG PO CAPS: 75.0000 mg | ORAL_CAPSULE | Freq: Two times a day (BID) | ORAL | Status: DC

## 2013-06-10 NOTE — Telephone Encounter (Signed)
Pt 4 y,o. Old here today with confirmed influenza A, mother has same symptoms fever, body aches, cough, will send in Tamiflu for treatment Given note for work

## 2013-06-20 ENCOUNTER — Encounter: Payer: Self-pay | Admitting: Family Medicine

## 2013-06-20 ENCOUNTER — Ambulatory Visit (INDEPENDENT_AMBULATORY_CARE_PROVIDER_SITE_OTHER): Payer: 59 | Admitting: Family Medicine

## 2013-06-20 VITALS — BP 108/80 | HR 78 | Temp 97.5°F | Resp 18 | Wt 159.0 lb

## 2013-06-20 DIAGNOSIS — J45909 Unspecified asthma, uncomplicated: Secondary | ICD-10-CM

## 2013-06-20 DIAGNOSIS — J45901 Unspecified asthma with (acute) exacerbation: Secondary | ICD-10-CM

## 2013-06-20 MED ORDER — PREDNISONE 20 MG PO TABS: ORAL_TABLET | ORAL | Status: DC

## 2013-06-20 MED ORDER — ALBUTEROL SULFATE HFA 108 (90 BASE) MCG/ACT IN AERS: 2.0000 | INHALATION_SPRAY | Freq: Four times a day (QID) | RESPIRATORY_TRACT | Status: DC | PRN

## 2013-06-20 NOTE — Progress Notes (Signed)
Subjective:    Patient ID: Gwendolyn Mitchell, female    DOB: 1975-11-09, 38 y.o.   MRN: [id number]  HPI Patient has a long-standing history of asthma. This morning she went to work. 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 It immediately cleared of bronchospasm. She began wheezing and could not breathe. She had to leave the office immediately. Since getting into clear area of her bronchospasm has improved. She is no longer wheezing. She still feels slightly short of breath but she feels like she is improving. Prior to today she was doing fine with no upper respiratory symptoms, no cough, no chest pain, and no fever. At present she feels mildly short of breath but is otherwise doing well. Past Medical History  Diagnosis Date  . Asthma   . Arnold-Chiari malformation   . Migraines   . Mitral valve prolapse 2012    Mild    Current Outpatient Prescriptions on File Prior to Visit  Medication Sig Dispense Refill  . Multiple Vitamin (MULTIVITAMIN) tablet Take 1 tablet by mouth daily.      Marland Kitchen albuterol (PROVENTIL) (2.5 MG/3ML) 0.083% nebulizer solution Take 2.5 mg by nebulization every 6 (six) hours as needed for wheezing.      . ALBUTEROL SULFATE HFA IN Inhale 2 puffs into the lungs 4 (four) times daily as needed.      Marland Kitchen dextromethorphan-guaiFENesin (MUCINEX DM) 30-600 MG per 12 hr tablet Take 1 tablet by mouth every 12 (twelve) hours.      Marland Kitchen oseltamivir (TAMIFLU) 75 MG capsule Take 1 capsule (75 mg total) by mouth 2 (two) times daily.  10 capsule  0   No current facility-administered medications on file prior to visit.   No Known Allergies History   Social History  . Marital Status: Married    Spouse Name: N/A    Number of Children: N/A  . Years of Education: N/A   Occupational History  . Not on file.   Social History Main Topics  . Smoking status: Never Smoker   . Smokeless tobacco: Never Used  . Alcohol Use: No  . Drug Use: No  . Sexual Activity: Yes   Birth Control/ Protection: Condom   Other Topics Concern  . Not on file   Social History Narrative  . No narrative on file      Review of Systems  All other systems reviewed and are negative.       Objective:   Physical Exam  Vitals reviewed. HENT:  Right Ear: External ear normal.  Left Ear: External ear normal.  Nose: Nose normal.  Mouth/Throat: Oropharynx is clear and moist. No oropharyngeal exudate.  Cardiovascular: Normal rate, regular rhythm and normal heart sounds.   No murmur heard. Pulmonary/Chest: Effort normal and breath sounds normal. No respiratory distress. She has no wheezes. She has no rales.          Assessment & Plan:  1. Asthma attack Patient is having a bronchospasm due to chemical irritant.  Avoid the office for the next 48 hours until the paint fumes dissipate.  Begin albuterol 2+ inhale every 6 hours as needed for bronchospasm. If this worsens she is to begin a prednisone dosepak immediately. - albuterol (PROVENTIL HFA;VENTOLIN HFA) 108 (90 BASE) MCG/ACT inhaler; Inhale 2 puffs into the lungs every 6 (six) hours as needed for wheezing or shortness of breath.  Dispense: 1 Inhaler; Refill: 0 - predniSONE (DELTASONE) 20 MG tablet; 3 tabs poqday 1-2, 2 tabs poqday  3-4, 1 tab poqday 5-6  Dispense: 12 tablet; Refill: 0

## 2013-09-25 ENCOUNTER — Ambulatory Visit (INDEPENDENT_AMBULATORY_CARE_PROVIDER_SITE_OTHER): Payer: 59 | Admitting: Family Medicine

## 2013-09-25 VITALS — BP 100/70 | HR 69 | Temp 98.4°F | Resp 16 | Ht 69.0 in | Wt 156.4 lb

## 2013-09-25 DIAGNOSIS — R1011 Right upper quadrant pain: Secondary | ICD-10-CM

## 2013-09-25 DIAGNOSIS — J029 Acute pharyngitis, unspecified: Secondary | ICD-10-CM

## 2013-09-25 MED ORDER — AMOXICILLIN 875 MG PO TABS: 875.0000 mg | ORAL_TABLET | Freq: Two times a day (BID) | ORAL | Status: DC

## 2013-09-25 MED ORDER — CHLORHEXIDINE GLUCONATE 0.12 % MT SOLN: 15.0000 mL | Freq: Two times a day (BID) | OROMUCOSAL | Status: DC

## 2013-09-25 NOTE — Progress Notes (Signed)
@[email or web address] @  Patient ID: Gwendolyn Mitchell MRN: [id number], DOB: Sep 12, 1975, 38 y.o. Date of Encounter: 09/25/2013, 6:22 PM  Primary Physician: Odette Fraction, MD  Chief Complaint: sore throat  HPI: 38 y.o. year old female with history below presents with sore throat for 8 days and right upper quadrant pain for a year. She has had a mild fever each evening. She has minimal cough but does have a little bit of flaking from her left ear  Her daughter also has a sore throat.  Patient states that her primary care doctor told her that her right upper quadrant was secondary to the way she sits. Nevertheless this been fairly constant for a year and hasn't changed with position. She denies any fever From her abdominal pain or nausea or diarrhea. The pain is not influenced by E.   Past Medical History  Diagnosis Date   Asthma    Arnold-Chiari malformation    Migraines    Mitral valve prolapse 2012    Mild      Home Meds: Prior to Admission medications   Medication Sig Start Date End Date Taking? Authorizing Provider  albuterol (PROVENTIL HFA;VENTOLIN HFA) 108 (90 BASE) MCG/ACT inhaler Inhale 2 puffs into the lungs every 6 (six) hours as needed for wheezing or shortness of breath. 06/20/13  Yes Susy Frizzle, MD  albuterol (PROVENTIL) (2.5 MG/3ML) 0.083% nebulizer solution Take 2.5 mg by nebulization every 6 (six) hours as needed for wheezing.   Yes Historical Provider, MD  Multiple Vitamin (MULTIVITAMIN) tablet Take 1 tablet by mouth daily.   Yes Historical Provider, MD  amoxicillin (AMOXIL) 875 MG tablet Take 1 tablet (875 mg total) by mouth 2 (two) times daily. 09/25/13   [name], MD  chlorhexidine (PERIDEX) 0.12 % solution Use as directed 15 mLs in the mouth or throat 2 (two) times daily. 09/25/13   [name], MD    Allergies: No Known Allergies  History   Social History   Marital Status: Married    Spouse Name: N/A    Number of Children: N/A   Years of Education:  N/A   Occupational History   Not on file.   Social History Main Topics   Smoking status: Never Smoker    Smokeless tobacco: Never Used   Alcohol Use: No   Drug Use: No   Sexual Activity: Yes    Birth Web designer: Condom   Other Topics Concern   Not on file   Social History Narrative   No narrative on file     Review of Systems: Constitutional: negative for chills, fever, night sweats, weight changes, or fatigue  HEENT: negative for vision changes, hearing loss, congestion, rhinorrhea, ST, epistaxis, or sinus pressure Cardiovascular: negative for chest pain or palpitations Respiratory: negative for hemoptysis, wheezing, shortness of breath, or cough Abdominal: negative for abdominal pain, nausea, vomiting, diarrhea, or constipation Dermatological: negative for rash Neurologic: negative for headache, dizziness, or syncope All other systems reviewed and are otherwise negative with the exception to those above and in the HPI.   Physical Exam: Blood pressure 100/70, pulse 69, temperature 98.4 F (36.9 C), temperature source Oral, resp. rate 16, height 5\' 9"  (1.753 m), weight 156 lb 6.4 oz (70.943 kg), SpO2 100.00%., Body mass index is 23.09 kg/(m^2). General: Well developed, well nourished, in no acute distress. Head: Normocephalic, atraumatic, eyes without discharge, sclera non-icteric, nares are without discharge. Bilateral auditory canals clear, TM's are without perforation, pearly grey and translucent with reflective cone of light bilaterally.  Oral cavity moist, posterior pharynx without exudate, erythema, peritonsillar abscess, or post nasal drip.  Neck: Supple. No thyromegaly. Full ROM. No lymphadenopathy. Lungs: Clear bilaterally to auscultation without wheezes, rales, or rhonchi. Breathing is unlabored. Heart: RRR with S1 S2. No murmurs, rubs, or gallops appreciated. Abdomen: Soft, non-tender, non-distended with normoactive bowel sounds. No hepatomegaly. No  rebound/guarding. No obvious abdominal masses. Msk:  Strength and tone normal for age. Extremities/Skin: Warm and dry. No clubbing or cyanosis. No edema. No rashes or suspicious lesions. Neuro: Alert and oriented X 3. Moves all extremities spontaneously. Gait is normal. CNII-XII grossly in tact. Psych:  Responds to questions appropriately with a normal affect.   Labs:   ASSESSMENT AND PLAN:  38 y.o. year old female with sore throat for 8 days and RUQ abdominal pain for a year Acute pharyngitis - Plan: amoxicillin (AMOXIL) 875 MG tablet, chlorhexidine (PERIDEX) 0.12 % solution, Culture, Group A Strep  RUQ abdominal pain - Plan: US Abdomen Complete  Signed, [name], MD     Signed, [name], MD 09/25/2013 6:22 PM

## 2013-09-25 NOTE — Patient Instructions (Signed)
Pharyngitis °Pharyngitis is redness, pain, and swelling (inflammation) of your pharynx.  °CAUSES  °Pharyngitis is usually caused by infection. Most of the time, these infections are from viruses (viral) and are part of a cold. However, sometimes pharyngitis is caused by bacteria (bacterial). Pharyngitis can also be caused by allergies. Viral pharyngitis may be spread from person to person by coughing, sneezing, and personal items or utensils (cups, forks, spoons, toothbrushes). Bacterial pharyngitis may be spread from person to person by more intimate contact, such as kissing.  °SIGNS AND SYMPTOMS  °Symptoms of pharyngitis include:   °· Sore throat.   °· Tiredness (fatigue).   °· Low-grade fever.   °· Headache. °· Joint pain and muscle aches. °· Skin rashes. °· Swollen lymph nodes. °· Plaque-like film on throat or tonsils (often seen with bacterial pharyngitis). °DIAGNOSIS  °Your health care provider will ask you questions about your illness and your symptoms. Your medical history, along with a physical exam, is often all that is needed to diagnose pharyngitis. Sometimes, a rapid strep test is done. Other lab tests may also be done, depending on the suspected cause.  °TREATMENT  °Viral pharyngitis will usually get better in 3 4 days without the use of medicine. Bacterial pharyngitis is treated with medicines that kill germs (antibiotics).  °HOME CARE INSTRUCTIONS  °· Drink enough water and fluids to keep your urine clear or pale yellow.   °· Only take over-the-counter or prescription medicines as directed by your health care provider:   °· If you are prescribed antibiotics, make sure you finish them even if you start to feel better.   °· Do not take aspirin.   °· Get lots of rest.   °· Gargle with 8 oz of salt water (½ tsp of salt per 1 qt of water) as often as every 1 2 hours to soothe your throat.   °· Throat lozenges (if you are not at risk for choking) or sprays may be used to soothe your throat. °SEEK MEDICAL  CARE IF:  °· You have large, tender lumps in your neck. °· You have a rash. °· You cough up green, yellow-brown, or bloody spit. °SEEK IMMEDIATE MEDICAL CARE IF:  °· Your neck becomes stiff. °· You drool or are unable to swallow liquids. °· You vomit or are unable to keep medicines or liquids down. °· You have severe pain that does not go away with the use of recommended medicines. °· You have trouble breathing (not caused by a stuffy nose). °MAKE SURE YOU:  °· Understand these instructions. °· Will watch your condition. °· Will get help right away if you are not doing well or get worse. °Document Released: 05/30/2005 Document Revised: 03/20/2013 Document Reviewed: 02/04/2013 °[employer]® Patient Information ©2014 [employer]. ° °

## 2013-09-25 NOTE — Progress Notes (Signed)
Patient ID: Gwendolyn Mitchell MRN: [id number], DOB: Jul 05, 1975, 38 y.o. Date of Encounter: 09/25/2013, 6:18 PM  Primary Physician: Gwendolyn Fraction, MD  Chief Complaint:  Chief Complaint  Patient presents with  . Sore Throat    x 9 days  . Fever    low grade    HPI: 38 y.o. year old female presents with 8 day history of sore throat. Subjective fever and chills. No cough, congestion, rhinorrhea, sinus pressure, otalgia, or headache. Normal hearing. No GI complaints. Able to swallow saliva, but hurts to do so. Decreased appetite secondary to sore throat.   Past Medical History  Diagnosis Date  . Asthma   . Arnold-Chiari malformation   . Migraines   . Mitral valve prolapse 2012    Mild      Home Meds: Prior to Admission medications   Medication Sig Start Date End Date Taking? Authorizing Provider  albuterol (PROVENTIL HFA;VENTOLIN HFA) 108 (90 BASE) MCG/ACT inhaler Inhale 2 puffs into the lungs every 6 (six) hours as needed for wheezing or shortness of breath. 06/20/13  Yes Susy Frizzle, MD  albuterol (PROVENTIL) (2.5 MG/3ML) 0.083% nebulizer solution Take 2.5 mg by nebulization every 6 (six) hours as needed for wheezing.   Yes Historical Provider, MD  Multiple Vitamin (MULTIVITAMIN) tablet Take 1 tablet by mouth daily.   Yes Historical Provider, MD    Allergies: No Known Allergies  History   Social History  . Marital Status: Married    Spouse Name: N/A    Number of Children: N/A  . Years of Education: N/A   Occupational History  . Not on file.   Social History Main Topics  . Smoking status: Never Smoker   . Smokeless tobacco: Never Used  . Alcohol Use: No  . Drug Use: No  . Sexual Activity: Yes    Birth Control/ Protection: Condom   Other Topics Concern  . Not on file   Social History Narrative  . No narrative on file     Review of Systems: Constitutional: negative for chills,  night sweats or weight changes HEENT: see above Cardiovascular:  negative for chest pain or palpitations Respiratory: negative for hemoptysis, wheezing, or shortness of breath Abdominal: negative for abdominal pain, nausea, vomiting or diarrhea Dermatological: negative for rash Neurologic: negative for headache   Physical Exam: Blood pressure 100/70, pulse 69, temperature 98.4 F (36.9 C), temperature source Oral, resp. rate 16, height 5\' 9"  (1.753 m), weight 156 lb 6.4 oz (70.943 kg), SpO2 100.00%., Body mass index is 23.09 kg/(m^2). General: Well developed, well nourished, in no acute distress. Head: Normocephalic, atraumatic, eyes without discharge, sclera non-icteric, nares are patent. Bilateral auditory canals clear, TM's are without perforation, pearly grey with reflective cone of light bilaterally. No sinus TTP. Oral cavity moist, dentition normal. Posterior pharynx with post nasal drip and mild erythema. No peritonsillar abscess or tonsillar exudate. Neck: Supple. No thyromegaly. Full ROM. No lymphadenopathy. Lungs: Clear bilaterally to auscultation without wheezes, rales, or rhonchi. Breathing is unlabored. Heart: RRR with S1 S2. No murmurs, rubs, or gallops appreciated. Abdomen: Soft, non-tender, non-distended with normoactive bowel sounds. No hepatomegaly. No rebound/guarding. No obvious abdominal masses. Msk:  Strength and tone normal for age. Extremities: No clubbing or cyanosis. No edema. Neuro: Alert and oriented X 3. Moves all extremities spontaneously. CNII-XII grossly in tact. Psych:  Responds to questions appropriately with a normal affect.    ASSESSMENT AND PLAN:  39 y.o. year old female with Acute pharyngitis -  Plan: amoxicillin (AMOXIL) 875 MG tablet, chlorhexidine (PERIDEX) 0.12 % solution, Culture, Group A Strep   - -Tylenol/Motrin prn -Rest/fluids -RTC precautions -RTC 3-5 days if no improvement  Signed, Gwendolyn Haber, MD 09/25/2013 6:18 PM

## 2013-09-28 LAB — CULTURE, GROUP A STREP: Organism ID, Bacteria: NORMAL

## 2013-10-04 ENCOUNTER — Ambulatory Visit
Admission: RE | Admit: 2013-10-04 | Discharge: 2013-10-04 | Disposition: A | Discharge status: Home / Self Care | Payer: 59 | Source: Ambulatory Visit | Attending: Family Medicine | Admitting: Family Medicine

## 2013-10-04 DIAGNOSIS — R1011 Right upper quadrant pain: Secondary | ICD-10-CM

## 2013-10-07 ENCOUNTER — Other Ambulatory Visit: Payer: Self-pay

## 2013-10-07 DIAGNOSIS — R1011 Right upper quadrant pain: Secondary | ICD-10-CM

## 2013-10-17 ENCOUNTER — Encounter: Payer: Self-pay | Admitting: Internal Medicine

## 2013-11-14 ENCOUNTER — Other Ambulatory Visit: Payer: Self-pay | Admitting: Obstetrics and Gynecology

## 2013-12-17 ENCOUNTER — Ambulatory Visit (INDEPENDENT_AMBULATORY_CARE_PROVIDER_SITE_OTHER): Payer: 59 | Admitting: Internal Medicine

## 2013-12-17 ENCOUNTER — Encounter: Payer: Self-pay | Admitting: Internal Medicine

## 2013-12-17 VITALS — BP 100/70 | HR 84 | Ht 69.0 in | Wt 160.4 lb

## 2013-12-17 DIAGNOSIS — K769 Liver disease, unspecified: Secondary | ICD-10-CM

## 2013-12-17 DIAGNOSIS — D1803 Hemangioma of intra-abdominal structures: Secondary | ICD-10-CM | POA: Insufficient documentation

## 2013-12-17 DIAGNOSIS — R1011 Right upper quadrant pain: Secondary | ICD-10-CM

## 2013-12-17 DIAGNOSIS — K7689 Other specified diseases of liver: Secondary | ICD-10-CM

## 2013-12-17 HISTORY — DX: Hemangioma of intra-abdominal structures: D18.03

## 2013-12-17 NOTE — Progress Notes (Signed)
  Susy Frizzle, MD [address] [place], [place] [address]  Subjective:    Patient ID: Gwendolyn Mitchell, female    DOB: [birth year], 38 y.o.   MRN: [id number]  HPI Tjis is a very nice middle-aged woman sent by Dr. Samella Parr office because she has been havng right-sided abdominal pains. She had an [place] that showed a very small R lobe lesion in liver that was thought most likely to be a hemangioma. The pain is a vague, ache that occurs RUQ/flank area, lasts mins - 2hrs.  No clear triggers - not with movement or food. Not bothering sleep.Sxs x 1 year +. Has been told it might be coming from how she sits.  Medications, allergies, past medical history, past surgical history, family history and social history are reviewed and updated in the EMR.   Review of Systems + cough, palpitations, dyspnea, excessive urination - all other ROS negative    Objective:   Physical Exam General:  Well-developed, well-nourished and in no acute distress Eyes:  anicteric. ENT:   Mouth and posterior pharynx free of lesions.  Neck:   supple w/o thyromegaly or mass.  Lungs: Clear to auscultation bilaterally. Heart:  S1S2, no rubs, murmurs, gallops. Abdomen:  soft, non-tender, no hepatosplenomegaly, hernia, or mass and BS+. Ribs nontender and no pain with mm tension Extremities:   no edema Neuro:  A&O x 3.  Psych:  appropriate mood and  Affect.   Data Reviewed:  09/25/13 [place]  The liver has a normal echogenic pattern. However within the  superior right lobe posteriorly and laterally there is in echogenic  focus consistent with incidental hemangioma of 1.2 x 1.5 x 1.3 cm.      Assessment & Plan:  Liver lesion, right lobe Probably a small hemangioma Given sxs reimage with limited RUQ [place] in October and call with results  RUQ/flank pain Cause not clear Seems benign Korea in Oct   TM:[email or web address] TOM, MD

## 2013-12-17 NOTE — Assessment & Plan Note (Signed)
Cause not clear Seems benign [place] in Oct

## 2013-12-17 NOTE — Assessment & Plan Note (Signed)
Probably a small hemangioma Given sxs reimage with limited RUQ [place] in October and call with results

## 2013-12-17 NOTE — Patient Instructions (Signed)
You have been scheduled for an abdominal ultrasound at [facility] Radiology (1st floor of hospital) on 04/11/14 at 2:00pm . Please arrive 15 minutes prior to your appointment for registration. Make certain not to have anything to eat or drink 6 hours prior to your appointment. Should you need to reschedule your appointment, please contact radiology at [phone]. This test typically takes about 30 minutes to perform.  I appreciate the opportunity to care for you.

## 2013-12-29 ENCOUNTER — Observation Stay (HOSPITAL_COMMUNITY)
Admission: EM | Admit: 2013-12-29 | Discharge: 2013-12-30 | Disposition: A | Discharge status: Home / Self Care | Payer: 59 | Attending: Internal Medicine | Admitting: Internal Medicine

## 2013-12-29 DIAGNOSIS — F411 Generalized anxiety disorder: Secondary | ICD-10-CM | POA: Diagnosis present

## 2013-12-29 DIAGNOSIS — J40 Bronchitis, not specified as acute or chronic: Secondary | ICD-10-CM | POA: Diagnosis present

## 2013-12-29 DIAGNOSIS — R509 Fever, unspecified: Secondary | ICD-10-CM | POA: Diagnosis present

## 2013-12-29 DIAGNOSIS — Z79899 Other long term (current) drug therapy: Secondary | ICD-10-CM | POA: Insufficient documentation

## 2013-12-29 DIAGNOSIS — J45901 Unspecified asthma with (acute) exacerbation: Principal | ICD-10-CM

## 2013-12-29 DIAGNOSIS — J45909 Unspecified asthma, uncomplicated: Secondary | ICD-10-CM | POA: Diagnosis present

## 2013-12-29 DIAGNOSIS — Q054 Unspecified spina bifida with hydrocephalus: Secondary | ICD-10-CM

## 2013-12-29 DIAGNOSIS — R0602 Shortness of breath: Secondary | ICD-10-CM | POA: Diagnosis present

## 2013-12-29 DIAGNOSIS — K769 Liver disease, unspecified: Secondary | ICD-10-CM

## 2013-12-29 DIAGNOSIS — R000 Tachycardia, unspecified: Secondary | ICD-10-CM | POA: Diagnosis present

## 2013-12-29 DIAGNOSIS — R1011 Right upper quadrant pain: Secondary | ICD-10-CM

## 2013-12-29 DIAGNOSIS — J9600 Acute respiratory failure, unspecified whether with hypoxia or hypercapnia: Secondary | ICD-10-CM | POA: Diagnosis present

## 2013-12-29 DIAGNOSIS — I341 Nonrheumatic mitral (valve) prolapse: Secondary | ICD-10-CM

## 2013-12-29 DIAGNOSIS — R51 Headache: Secondary | ICD-10-CM

## 2013-12-29 DIAGNOSIS — R8789 Other abnormal findings in specimens from female genital organs: Secondary | ICD-10-CM

## 2013-12-29 DIAGNOSIS — I059 Rheumatic mitral valve disease, unspecified: Secondary | ICD-10-CM | POA: Insufficient documentation

## 2013-12-29 DIAGNOSIS — R6510 Systemic inflammatory response syndrome (SIRS) of non-infectious origin without acute organ dysfunction: Secondary | ICD-10-CM

## 2013-12-29 DIAGNOSIS — G43909 Migraine, unspecified, not intractable, without status migrainosus: Secondary | ICD-10-CM | POA: Insufficient documentation

## 2013-12-29 DIAGNOSIS — Z3202 Encounter for pregnancy test, result negative: Secondary | ICD-10-CM | POA: Insufficient documentation

## 2013-12-30 ENCOUNTER — Emergency Department (HOSPITAL_COMMUNITY): Payer: 59

## 2013-12-30 ENCOUNTER — Encounter (HOSPITAL_COMMUNITY): Payer: Self-pay | Admitting: Emergency Medicine

## 2013-12-30 DIAGNOSIS — J45901 Unspecified asthma with (acute) exacerbation: Secondary | ICD-10-CM

## 2013-12-30 DIAGNOSIS — J45909 Unspecified asthma, uncomplicated: Secondary | ICD-10-CM | POA: Diagnosis present

## 2013-12-30 DIAGNOSIS — R509 Fever, unspecified: Secondary | ICD-10-CM | POA: Diagnosis present

## 2013-12-30 DIAGNOSIS — J40 Bronchitis, not specified as acute or chronic: Secondary | ICD-10-CM | POA: Diagnosis present

## 2013-12-30 DIAGNOSIS — J9600 Acute respiratory failure, unspecified whether with hypoxia or hypercapnia: Secondary | ICD-10-CM

## 2013-12-30 DIAGNOSIS — R0602 Shortness of breath: Secondary | ICD-10-CM | POA: Diagnosis present

## 2013-12-30 DIAGNOSIS — R000 Tachycardia, unspecified: Secondary | ICD-10-CM

## 2013-12-30 LAB — CBC WITH DIFFERENTIAL/PLATELET
BASOS PCT: 0 % (ref 0–1)
Basophils Absolute: 0 10*3/uL (ref 0.0–0.1)
EOS PCT: 5 % (ref 0–5)
Eosinophils Absolute: 0.6 10*3/uL (ref 0.0–0.7)
HEMATOCRIT: 41.5 % (ref 36.0–46.0)
HEMOGLOBIN: 13.4 g/dL (ref 12.0–15.0)
Lymphocytes Relative: 10 % — ABNORMAL LOW (ref 12–46)
Lymphs Abs: 1.2 10*3/uL (ref 0.7–4.0)
MCH: 28.4 pg (ref 26.0–34.0)
MCHC: 32.3 g/dL (ref 30.0–36.0)
MCV: 87.9 fL (ref 78.0–100.0)
MONO ABS: 0.7 10*3/uL (ref 0.1–1.0)
MONOS PCT: 6 % (ref 3–12)
Neutro Abs: 9.7 10*3/uL — ABNORMAL HIGH (ref 1.7–7.7)
Neutrophils Relative %: 79 % — ABNORMAL HIGH (ref 43–77)
Platelets: 237 10*3/uL (ref 150–400)
RBC: 4.72 MIL/uL (ref 3.87–5.11)
RDW: 12.8 % (ref 11.5–15.5)
WBC: 12.2 10*3/uL — ABNORMAL HIGH (ref 4.0–10.5)

## 2013-12-30 LAB — URINALYSIS, ROUTINE W REFLEX MICROSCOPIC
BILIRUBIN URINE: NEGATIVE
Glucose, UA: NEGATIVE mg/dL
KETONES UR: NEGATIVE mg/dL
Leukocytes, UA: NEGATIVE
NITRITE: NEGATIVE
PH: 7 (ref 5.0–8.0)
Protein, ur: NEGATIVE mg/dL
Specific Gravity, Urine: 1.008 (ref 1.005–1.030)
UROBILINOGEN UA: 0.2 mg/dL (ref 0.0–1.0)

## 2013-12-30 LAB — CBC
HCT: 39.6 % (ref 36.0–46.0)
Hemoglobin: 12.9 g/dL (ref 12.0–15.0)
MCH: 28.8 pg (ref 26.0–34.0)
MCHC: 32.6 g/dL (ref 30.0–36.0)
MCV: 88.4 fL (ref 78.0–100.0)
PLATELETS: 239 10*3/uL (ref 150–400)
RBC: 4.48 MIL/uL (ref 3.87–5.11)
RDW: 13.1 % (ref 11.5–15.5)
WBC: 12.3 10*3/uL — ABNORMAL HIGH (ref 4.0–10.5)

## 2013-12-30 LAB — BASIC METABOLIC PANEL
Anion gap: 17 — ABNORMAL HIGH (ref 5–15)
Anion gap: 17 — ABNORMAL HIGH (ref 5–15)
BUN: 5 mg/dL — AB (ref 6–23)
BUN: 5 mg/dL — AB (ref 6–23)
CALCIUM: 9.1 mg/dL (ref 8.4–10.5)
CHLORIDE: 101 meq/L (ref 96–112)
CO2: 22 mEq/L (ref 19–32)
CO2: 20 mEq/L (ref 19–32)
CREATININE: 0.72 mg/dL (ref 0.50–1.10)
Calcium: 8.5 mg/dL (ref 8.4–10.5)
Chloride: 105 mEq/L (ref 96–112)
Creatinine, Ser: 0.59 mg/dL (ref 0.50–1.10)
GFR calc non Af Amer: >90 mL/min (ref >90)
GFR calc non Af Amer: >90 mL/min (ref >90)
Glucose, Bld: 115 mg/dL — ABNORMAL HIGH (ref 70–99)
Glucose, Bld: 167 mg/dL — ABNORMAL HIGH (ref 70–99)
POTASSIUM: 4.1 meq/L (ref 3.7–5.3)
Potassium: 4.2 mEq/L (ref 3.7–5.3)
Sodium: 140 mEq/L (ref 137–147)
Sodium: 142 mEq/L (ref 137–147)

## 2013-12-30 LAB — LACTIC ACID, PLASMA: LACTIC ACID, VENOUS: 2.4 mmol/L — AB (ref 0.5–2.2)

## 2013-12-30 LAB — URINE MICROSCOPIC-ADD ON

## 2013-12-30 LAB — D-DIMER, QUANTITATIVE: D-Dimer, Quant: 0.73 ug/mL-FEU — ABNORMAL HIGH (ref 0.00–0.48)

## 2013-12-30 LAB — PREGNANCY, URINE: Preg Test, Ur: NEGATIVE

## 2013-12-30 MED ORDER — PREDNISONE 10 MG PO TABS: ORAL_TABLET | ORAL | Status: DC

## 2013-12-30 MED ORDER — PREDNISONE 20 MG PO TABS
60.0000 mg | ORAL_TABLET | Freq: Once | ORAL | Status: AC
  Administered 2013-12-30: 60 mg via ORAL
  Filled 2013-12-30: qty 3

## 2013-12-30 MED ORDER — ACETAMINOPHEN 650 MG RE SUPP: 650.0000 mg | Freq: Four times a day (QID) | RECTAL | Status: DC | PRN

## 2013-12-30 MED ORDER — IPRATROPIUM BROMIDE 0.02 % IN SOLN
0.5000 mg | Freq: Four times a day (QID) | RESPIRATORY_TRACT | Status: DC
  Administered 2013-12-30: 0.5 mg via RESPIRATORY_TRACT
  Filled 2013-12-30: qty 2.5

## 2013-12-30 MED ORDER — ALBUTEROL SULFATE (2.5 MG/3ML) 0.083% IN NEBU
5.0000 mg | INHALATION_SOLUTION | Freq: Once | RESPIRATORY_TRACT | Status: AC
  Administered 2013-12-30: 5 mg via RESPIRATORY_TRACT
  Filled 2013-12-30: qty 6

## 2013-12-30 MED ORDER — PREDNISONE 20 MG PO TABS
20.0000 mg | ORAL_TABLET | Freq: Every day | ORAL | Status: DC
  Administered 2013-12-30: 20 mg via ORAL
  Filled 2013-12-30 (×2): qty 1

## 2013-12-30 MED ORDER — GUAIFENESIN ER 600 MG PO TB12
600.0000 mg | ORAL_TABLET | Freq: Two times a day (BID) | ORAL | Status: DC | PRN
  Filled 2013-12-30: qty 1

## 2013-12-30 MED ORDER — ALBUTEROL SULFATE (2.5 MG/3ML) 0.083% IN NEBU
2.5000 mg | INHALATION_SOLUTION | Freq: Four times a day (QID) | RESPIRATORY_TRACT | Status: DC
  Administered 2013-12-30: 2.5 mg via RESPIRATORY_TRACT
  Filled 2013-12-30: qty 3

## 2013-12-30 MED ORDER — SODIUM CHLORIDE 0.9 % IV BOLUS (SEPSIS)
1000.0000 mL | Freq: Once | INTRAVENOUS | Status: AC
  Administered 2013-12-30: 1000 mL via INTRAVENOUS

## 2013-12-30 MED ORDER — GUAIFENESIN-DM 100-10 MG/5ML PO SYRP
5.0000 mL | ORAL_SOLUTION | ORAL | Status: DC | PRN
  Filled 2013-12-30: qty 5

## 2013-12-30 MED ORDER — DEXTROSE 5 % IV SOLN
1.0000 g | INTRAVENOUS | Status: DC
  Administered 2013-12-30: 1 g via INTRAVENOUS
  Filled 2013-12-30: qty 10

## 2013-12-30 MED ORDER — ALBUTEROL SULFATE (2.5 MG/3ML) 0.083% IN NEBU: 2.5000 mg | INHALATION_SOLUTION | RESPIRATORY_TRACT | Status: DC | PRN

## 2013-12-30 MED ORDER — DEXTROSE 5 % IV SOLN
500.0000 mg | Freq: Once | INTRAVENOUS | Status: AC
  Administered 2013-12-30: 500 mg via INTRAVENOUS
  Filled 2013-12-30: qty 500

## 2013-12-30 MED ORDER — IPRATROPIUM-ALBUTEROL 0.5-2.5 (3) MG/3ML IN SOLN
3.0000 mL | Freq: Once | RESPIRATORY_TRACT | Status: AC
  Administered 2013-12-30: 3 mL via RESPIRATORY_TRACT
  Filled 2013-12-30: qty 3

## 2013-12-30 MED ORDER — ADULT MULTIVITAMIN W/MINERALS CH
1.0000 | ORAL_TABLET | Freq: Every day | ORAL | Status: DC
  Administered 2013-12-30: 1 via ORAL
  Filled 2013-12-30: qty 1

## 2013-12-30 MED ORDER — MOMETASONE FURO-FORMOTEROL FUM 100-5 MCG/ACT IN AERO
2.0000 | INHALATION_SPRAY | Freq: Two times a day (BID) | RESPIRATORY_TRACT | Status: DC
  Administered 2013-12-30: 2 via RESPIRATORY_TRACT
  Filled 2013-12-30: qty 8.8

## 2013-12-30 MED ORDER — LEVOFLOXACIN 500 MG PO TABS: 500.0000 mg | ORAL_TABLET | Freq: Every day | ORAL | Status: DC

## 2013-12-30 MED ORDER — DEXTROSE 5 % IV SOLN
1.0000 g | Freq: Once | INTRAVENOUS | Status: AC
  Administered 2013-12-30: 1 g via INTRAVENOUS
  Filled 2013-12-30: qty 10

## 2013-12-30 MED ORDER — ONDANSETRON HCL 4 MG/2ML IJ SOLN: 4.0000 mg | Freq: Four times a day (QID) | INTRAMUSCULAR | Status: DC | PRN

## 2013-12-30 MED ORDER — SODIUM CHLORIDE 0.9 % IV SOLN
INTRAVENOUS | Status: DC
  Administered 2013-12-30: 75 mL/h via INTRAVENOUS

## 2013-12-30 MED ORDER — ACETAMINOPHEN 325 MG PO TABS: 650.0000 mg | ORAL_TABLET | Freq: Four times a day (QID) | ORAL | Status: DC | PRN

## 2013-12-30 MED ORDER — ONDANSETRON HCL 4 MG PO TABS: 4.0000 mg | ORAL_TABLET | Freq: Four times a day (QID) | ORAL | Status: DC | PRN

## 2013-12-30 MED ORDER — ENOXAPARIN SODIUM 40 MG/0.4ML ~~LOC~~ SOLN
40.0000 mg | SUBCUTANEOUS | Status: DC
  Administered 2013-12-30: 40 mg via SUBCUTANEOUS
  Filled 2013-12-30 (×2): qty 0.4

## 2013-12-30 MED ORDER — DEXTROSE 5 % IV SOLN
500.0000 mg | INTRAVENOUS | Status: DC
  Administered 2013-12-30: 500 mg via INTRAVENOUS
  Filled 2013-12-30: qty 500

## 2013-12-30 NOTE — Progress Notes (Signed)
Utilization review completed.  

## 2013-12-30 NOTE — Discharge Summary (Signed)
Physician Discharge Summary  Gwendolyn Mitchell EXH:[id number] DOB: [birth year] DOA: 12/29/2013  PCP: Gwendolyn Fraction, MD  Admit date: 12/29/2013 Discharge date: 12/30/2013  Discharge Diagnoses:  Principal Problem:   Acute respiratory failure Active Problems:   Asthma exacerbation   Acute Bronchitis   Tachycardia   Discharge Condition: stable  Filed Weights   12/30/13 0004  Weight: 70.308 kg (155 lb)    History of present illness:  38 y.o. Caucasian female with history of asthma, migraines, Arnold-Chiari malformation, and mitral valve prolapse who presents with the above complaints. Patient reports that her symptoms started on 12/27/2013 when she started having a sore throat. She then started having myalgias and cough productive for sputum. She also has been wheezy at home with increased use of nebulizer and inhalers. Last night around 9 p.m. she reported a temperature of 103. As a result she presented to the emergency department for further evaluation. In the emergency department patient received nebulizer therapy. She denies any chest pain, but does complain of pain in her lungs due to cough which is productive for greenish yellow sputum. Denies any abdominal pain, diarrhea, headaches or vision changes.   Hospital Course:  Observed overnight. Given antibiotics, steroids, bronchodilators, antipyretics. Improved quickly and by discharge, normal vital signs and able to ambulate about the unit.  Procedures:  none  Consultations:  none  Discharge Exam: Filed Vitals:   12/30/13 0946  BP: 107/67  Pulse: 104  Temp: 98.2 F (36.8 C)  Resp: 20    General: comfortable Cardiovascular: RRR Respiratory: CTA   Discharge Instructions   Diet general    Complete by:  As directed      Increase activity slowly    Complete by:  As directed             Medication List         acetaminophen 500 MG tablet  Commonly known as:  TYLENOL  Take 1,000 mg by mouth every 6 (six) hours as  needed for mild pain.     albuterol 108 (90 BASE) MCG/ACT inhaler  Commonly known as:  PROVENTIL HFA;VENTOLIN HFA  Inhale 2 puffs into the lungs every 6 (six) hours as needed for wheezing or shortness of breath.     Fluticasone-Salmeterol 250-50 MCG/DOSE Aepb  Commonly known as:  ADVAIR  Inhale 1 puff into the lungs 2 (two) times daily.     guaiFENesin 600 MG 12 hr tablet  Commonly known as:  MUCINEX  Take 600 mg by mouth 2 (two) times daily as needed for cough.     ibuprofen 200 MG tablet  Commonly known as:  ADVIL,MOTRIN  Take 400 mg by mouth every 6 (six) hours as needed for mild pain.     levofloxacin 500 MG tablet  Commonly known as:  LEVAQUIN  Take 1 tablet (500 mg total) by mouth daily.     multivitamin tablet  Take 1 tablet by mouth daily.     predniSONE 10 MG tablet  Commonly known as:  DELTASONE  4 tablets PO on Tuesday, then decrease by 1 tablet daily until off       No Known Allergies     Follow-up Information   Follow up with [facility] TOM, MD. (If symptoms worsen)    Specialty:  Family Medicine   Contact information:   [address] [place] [address] [phone]        The results of significant diagnostics from this hospitalization (including imaging, microbiology, ancillary and  laboratory) are listed below for reference.    Significant Diagnostic Studies: Dg Chest 2 View  12/30/2013   CLINICAL DATA:  Shortness of breath.  History of asthma.  EXAM: CHEST  2 VIEW  COMPARISON:  Chest radiograph performed 10/28/2008, and CTA of the chest performed 10/29/2008  FINDINGS: The lungs are well-aerated and clear. There is no evidence of focal opacification, pleural effusion or pneumothorax.  The heart is normal in size; the mediastinal contour is within normal limits. No acute osseous abnormalities are seen.  IMPRESSION: No acute cardiopulmonary process seen.   Electronically Signed   By: Gwendolyn Mitchell M.D.   On: 12/30/2013 01:32     Microbiology: No results found for this or any previous visit (from the past 240 hour(s)).   Labs: Basic Metabolic Panel:  Recent Labs Lab 12/30/13 0204 12/30/13 0624  NA 140 142  K 4.2 4.1  CL 101 105  CO2 22 20  GLUCOSE 115* 167*  BUN 5* 5*  CREATININE 0.72 0.59  CALCIUM 9.1 8.5   Liver Function Tests: No results found for this basename: AST, ALT, ALKPHOS, BILITOT, PROT, ALBUMIN,  in the last 168 hours No results found for this basename: LIPASE, AMYLASE,  in the last 168 hours No results found for this basename: AMMONIA,  in the last 168 hours CBC:  Recent Labs Lab 12/30/13 0204 12/30/13 0624  WBC 12.2* 12.3*  NEUTROABS 9.7*  --   HGB 13.4 12.9  HCT 41.5 39.6  MCV 87.9 88.4  PLT 237 239   Cardiac Enzymes: No results found for this basename: CKTOTAL, CKMB, CKMBINDEX, TROPONINI,  in the last 168 hours BNP: BNP (last 3 results) No results found for this basename: PROBNP,  in the last 8760 hours CBG: No results found for this basename: GLUCAP,  in the last 168 hours     Signed:  Gwendolynn Mitchell L  [facility] 12/30/2013, 11:49 AM

## 2013-12-30 NOTE — ED Notes (Signed)
hospitalist at the bedside 

## 2013-12-30 NOTE — ED Notes (Signed)
C/o asthma flare-up since this morning.  Using inhalers and albuterol neb without relief.  Reports productive cough with yellow phlegm and runny nose since yesterday.  Also reports fever 103 at home.  Last Ibuprofen at 9:30pm and last Tylenol at 11:30pm.

## 2013-12-30 NOTE — H&P (Signed)
Patient's PCP: Odette Fraction, MD  Chief Complaint: Shortness of breath  History of Present Illness: Gwendolyn Mitchell is a 38 y.o. Caucasian female with history of asthma, migraines, Arnold-Chiari malformation, and mitral valve prolapse who presents with the above complaints.  Patient reports that her symptoms started on 12/27/2013 when she started having a sore throat.  She then started having myalgias and cough productive for sputum.  She also has been wheezy at home with increased use of nebulizer and inhalers.  Last night around 9 p.m. she reported a temperature of 103.  As a result she presented to the emergency department for further evaluation.  In the emergency department patient received nebulizer therapy.  She denies any chest pain, but does complain of pain in her lungs due to cough which is productive for greenish yellow sputum.  Denies any abdominal pain, diarrhea, headaches or vision changes.  Review of Systems: All systems reviewed with the patient and positive as per history of present illness, otherwise all other systems are negative.  Past Medical History  Diagnosis Date  . Asthma   . Arnold-Chiari malformation   . Migraines   . Mitral valve prolapse 2012    Mild    History reviewed. No pertinent past surgical history. Family History  Problem Relation Age of Onset  . Diabetes Father   . Heart disease Father   . Skin cancer Father   . Hypertension Father   . Hyperlipidemia Father   . Rheum arthritis Brother   . Diabetes Brother   . Hyperlipidemia Brother   . Hypertension Brother    History   Social History  . Marital Status: Married    Spouse Name: N/A    Number of Children: 3  . Years of Education: N/A   Occupational History  . A/P    Social History Main Topics  . Smoking status: Never Smoker   . Smokeless tobacco: Never Used  . Alcohol Use: No  . Drug Use: No  . Sexual Activity: Yes    Birth Control/ Protection: Condom   Other Topics Concern  .  Not on file   Social History Narrative  . No narrative on file   Allergies: Review of patient's allergies indicates no known allergies.  Home Meds: Prior to Admission medications   Medication Sig Start Date End Date Taking? Authorizing Provider  acetaminophen (TYLENOL) 500 MG tablet Take 1,000 mg by mouth every 6 (six) hours as needed for mild pain.   Yes Historical Provider, MD  albuterol (PROVENTIL HFA;VENTOLIN HFA) 108 (90 BASE) MCG/ACT inhaler Inhale 2 puffs into the lungs every 6 (six) hours as needed for wheezing or shortness of breath. 06/20/13  Yes Susy Frizzle, MD  Fluticasone-Salmeterol (ADVAIR) 250-50 MCG/DOSE AEPB Inhale 1 puff into the lungs 2 (two) times daily.   Yes Historical Provider, MD  guaiFENesin (MUCINEX) 600 MG 12 hr tablet Take 600 mg by mouth 2 (two) times daily as needed for cough.    Yes Historical Provider, MD  ibuprofen (ADVIL,MOTRIN) 200 MG tablet Take 400 mg by mouth every 6 (six) hours as needed for mild pain.   Yes Historical Provider, MD  Multiple Vitamin (MULTIVITAMIN) tablet Take 1 tablet by mouth daily.   Yes Historical Provider, MD    Physical Exam: Blood pressure 114/64, pulse 105, temperature 99 F (37.2 C), temperature source Oral, resp. rate 23, height 5\' 9"  (1.753 m), weight 70.308 kg (155 lb), last menstrual period 12/28/2013, SpO2 97.00%. General: Awake, Oriented x3, No acute  distress. HEENT: EOMI, Moist mucous membranes Neck: Supple CV: S1 and S2 Lungs: Clear to ascultation bilaterally Abdomen: Soft, Nontender, Nondistended, +bowel sounds. Ext: Good pulses. Trace edema. No clubbing or cyanosis noted. Neuro: Cranial Nerves II-XII grossly intact. Has 5/5 motor strength in upper and lower extremities.  Lab results:  Recent Labs  12/30/13 0204  NA 140  K 4.2  CL 101  CO2 22  GLUCOSE 115*  BUN 5*  CREATININE 0.72  CALCIUM 9.1   No results found for this basename: AST, ALT, ALKPHOS, BILITOT, PROT, ALBUMIN,  in the last 72  hours No results found for this basename: LIPASE, AMYLASE,  in the last 72 hours  Recent Labs  12/30/13 0204  WBC 12.2*  NEUTROABS 9.7*  HGB 13.4  HCT 41.5  MCV 87.9  PLT 237   No results found for this basename: CKTOTAL, CKMB, CKMBINDEX, TROPONINI,  in the last 72 hours No components found with this basename: POCBNP,  No results found for this basename: DDIMER,  in the last 72 hours No results found for this basename: HGBA1C,  in the last 72 hours No results found for this basename: CHOL, HDL, LDLCALC, TRIG, CHOLHDL, LDLDIRECT,  in the last 72 hours No results found for this basename: TSH, T4TOTAL, FREET3, T3FREE, THYROIDAB,  in the last 72 hours No results found for this basename: VITAMINB12, FOLATE, FERRITIN, TIBC, IRON, RETICCTPCT,  in the last 72 hours Imaging results:  Dg Chest 2 View  12/30/2013   CLINICAL DATA:  Shortness of breath.  History of asthma.  EXAM: CHEST  2 VIEW  COMPARISON:  Chest radiograph performed 10/28/2008, and CTA of the chest performed 10/29/2008  FINDINGS: The lungs are well-aerated and clear. There is no evidence of focal opacification, pleural effusion or pneumothorax.  The heart is normal in size; the mediastinal contour is within normal limits. No acute osseous abnormalities are seen.  IMPRESSION: No acute cardiopulmonary process seen.   Electronically Signed   By: Garald Balding M.D.   On: 12/30/2013 01:32   Other results: EKG: Sinus tachycardia.  Assessment & Plan by Problem: Acute respiratory failure Due to asthma exacerbation and bronchitis.  Chest x-ray shows no acute cardiopulmonary process.  However, given patient is complaining of cough productive sputum and fever continue ceftriaxone and azithromycin started in the emergency department.  Continue scheduled and when necessary nebulizer therapy.  Patient's d-dimer is pending, if positive will need further evaluation with CT of her chest to rule out pulmonary embolism.  Fever Likely due to  bronchitis.  Antibiotics as indicated above.  Asthma exacerbation Patient given a dose of prednisone in the emergency department.  Continue prednisone 20 mg daily.  Continue nebulizer therapy.  Tachycardia Likely due to fever and nebulizer therapy.  Sinus.  Continue to monitor.  Prophylaxis Lovenox.  CODE STATUS Full code.  Disposition Admit the patient to medical bed as observation.  Time spent on admission, talking to the patient, and coordinating care was: 50 mins.  Joud Pettinato A, MD 12/30/2013, 4:27 AM

## 2013-12-30 NOTE — Progress Notes (Signed)
[facility] [id number] [place] [address] Phone: [phone] Fax: [phone]  December 30, 2013  Patient: Gwendolyn Mitchell  Date of Birth: Mar 17, 1976  Date of Visit: 12/29/2013    To Whom It May Concern:  Juelz Claar was seen and treated in our emergency department on 12/29/2013. SAIDEE GEREMIA  may return to work on 01/06/14.  Sincerely,      Doree Barthel, MD [facility]

## 2013-12-30 NOTE — Progress Notes (Signed)
Attempted to get report x 1. 

## 2013-12-30 NOTE — Progress Notes (Signed)
New Admission Note:   Arrival Method: per stretcher from ED with NT and husband Mental Orientation: alert, oriented X4, calm, cooperative Telemetry: not indicated Assessment: Completed Skin: warm, dry, intact, no wound noted IV: on the left antecubital area, clean, dry, intact with transparent dressing Pain: denies having any pain as of this time Tubes: none noted Safety Measures: Safety Fall Prevention Plan has been given, discussed and signed Admission: Completed [address] [place] Orientation: Patient has been orientated to the room, unit and staff.  Family: husband at bedside  Orders have been reviewed and implemented. Will continue to monitor the patient. Call light has been placed within reach and bed alarm has been activated.   Georgeanna Harrison BSN, RN [place] [facility] [place]

## 2013-12-30 NOTE — ED Provider Notes (Signed)
CSN: [id number]     Arrival date & time 12/29/13  2354 History   First MD Initiated Contact with Patient 12/29/13 2359     Chief Complaint  Patient presents with  . Asthma     (Consider location/radiation/quality/duration/timing/severity/associated sxs/prior Treatment) HPI 38 yo woman with asthma, non-smoker, no previous hospitalizations for asthma. She comes in with wheezing and SOB since around 0900 yesterday. Her sx have been increasingly severe. She has had two days of a minimally productive cough and notes that she developed a fever with Tm of 103F at 2100h. She has had general malaise and has been in bed all day. She developed myalgias and chills with fever.   She used an Albuterol SVN PTA but did not experience improvement.   Past Medical History  Diagnosis Date  . Asthma   . Arnold-Chiari malformation   . Migraines   . Mitral valve prolapse 2012    Mild    History reviewed. No pertinent past surgical history. Family History  Problem Relation Age of Onset  . Diabetes Father   . Heart disease Father   . Skin cancer Father   . Hypertension Father   . Hyperlipidemia Father   . Rheum arthritis Brother   . Diabetes Brother   . Hyperlipidemia Brother   . Hypertension Brother    History  Substance Use Topics  . Smoking status: Never Smoker   . Smokeless tobacco: Never Used  . Alcohol Use: No   OB History   Grav Para Term Preterm Abortions TAB SAB Ect Mult Living                 Review of Systems  Ten point review of symptoms performed and is negative with the exception of symptoms noted above.   Allergies  Review of patient's allergies indicates no known allergies.  Home Medications   Prior to Admission medications   Medication Sig Start Date End Date Taking? Authorizing Provider  acetaminophen (TYLENOL) 500 MG tablet Take 1,000 mg by mouth every 6 (six) hours as needed for mild pain.   Yes Historical Provider, MD  albuterol (PROVENTIL HFA;VENTOLIN HFA) 108  (90 BASE) MCG/ACT inhaler Inhale 2 puffs into the lungs every 6 (six) hours as needed for wheezing or shortness of breath. 06/20/13  Yes Susy Frizzle, MD  Fluticasone-Salmeterol (ADVAIR) 250-50 MCG/DOSE AEPB Inhale 1 puff into the lungs 2 (two) times daily.   Yes Historical Provider, MD  guaiFENesin (MUCINEX) 600 MG 12 hr tablet Take 600 mg by mouth 2 (two) times daily as needed for cough.    Yes Historical Provider, MD  ibuprofen (ADVIL,MOTRIN) 200 MG tablet Take 400 mg by mouth every 6 (six) hours as needed for mild pain.   Yes Historical Provider, MD  Multiple Vitamin (MULTIVITAMIN) tablet Take 1 tablet by mouth daily.   Yes Historical Provider, MD   BP 111/60  Pulse 104  Temp(Src) 99 F (37.2 C) (Oral)  Resp 22  Ht 5\' 9"  (1.753 m)  Wt 155 lb (70.308 kg)  BMI 22.88 kg/m2  SpO2 95%  LMP 12/28/2013 Physical Exam Gen: well developed and well nourished appearing Head: NCAT Eyes: PERL, EOMI Nose: no epistaixis or rhinorrhea Mouth/throat: mucosa is moist and pink Neck: supple, no stridor Lungs: Respiratory rate 28-32 times per minute, CTA B, no wheezing, rhonchi or rales CV: Rapid regular, 108-112 beats per minute no murmur, extremities appear well perfused.  Abd: soft, notender, nondistended Back: no ttp, no cva ttp Skin: warm and dry  Ext: normal to inspection, no dependent edema Neuro: CN ii-xii grossly intact, no focal deficits Psyche; normal affect,  calm and cooperative.  ED Course  Procedures (including critical care time) Labs Review  Results for orders placed during the hospital encounter of 12/29/13 (from the past 24 hour(s))  URINALYSIS, ROUTINE W REFLEX MICROSCOPIC     Status: Abnormal   Collection Time    12/30/13  1:17 AM      Result Value Ref Range   Color, Urine YELLOW  YELLOW   APPearance CLOUDY (*) CLEAR   Specific Gravity, Urine 1.008  1.005 - 1.030   pH 7.0  5.0 - 8.0   Glucose, UA NEGATIVE  NEGATIVE mg/dL   Hgb urine dipstick SMALL (*) NEGATIVE    Bilirubin Urine NEGATIVE  NEGATIVE   Ketones, ur NEGATIVE  NEGATIVE mg/dL   Protein, ur NEGATIVE  NEGATIVE mg/dL   Urobilinogen, UA 0.2  0.0 - 1.0 mg/dL   Nitrite NEGATIVE  NEGATIVE   Leukocytes, UA NEGATIVE  NEGATIVE  URINE MICROSCOPIC-ADD ON     Status: None   Collection Time    12/30/13  1:17 AM      Result Value Ref Range   Squamous Epithelial / LPF RARE  RARE   WBC, UA 0-2  <3 WBC/hpf   RBC / HPF 3-6  <3 RBC/hpf   Bacteria, UA RARE  RARE  LACTIC ACID, PLASMA     Status: Abnormal   Collection Time    12/30/13  2:03 AM      Result Value Ref Range   Lactic Acid, Venous 2.4 (*) 0.5 - 2.2 mmol/L  CBC WITH DIFFERENTIAL     Status: Abnormal   Collection Time    12/30/13  2:04 AM      Result Value Ref Range   WBC 12.2 (*) 4.0 - 10.5 K/uL   RBC 4.72  3.87 - 5.11 MIL/uL   Hemoglobin 13.4  12.0 - 15.0 g/dL   HCT 41.5  36.0 - 46.0 %   MCV 87.9  78.0 - 100.0 fL   MCH 28.4  26.0 - 34.0 pg   MCHC 32.3  30.0 - 36.0 g/dL   RDW 12.8  11.5 - 15.5 %   Platelets 237  150 - 400 K/uL   Neutrophils Relative % 79 (*) 43 - 77 %   Neutro Abs 9.7 (*) 1.7 - 7.7 K/uL   Lymphocytes Relative 10 (*) 12 - 46 %   Lymphs Abs 1.2  0.7 - 4.0 K/uL   Monocytes Relative 6  3 - 12 %   Monocytes Absolute 0.7  0.1 - 1.0 K/uL   Eosinophils Relative 5  0 - 5 %   Eosinophils Absolute 0.6  0.0 - 0.7 K/uL   Basophils Relative 0  0 - 1 %   Basophils Absolute 0.0  0.0 - 0.1 K/uL  BASIC METABOLIC PANEL     Status: Abnormal   Collection Time    12/30/13  2:04 AM      Result Value Ref Range   Sodium 140  137 - 147 mEq/L   Potassium 4.2  3.7 - 5.3 mEq/L   Chloride 101  96 - 112 mEq/L   CO2 22  19 - 32 mEq/L   Glucose, Bld 115 (*) 70 - 99 mg/dL   BUN 5 (*) 6 - 23 mg/dL   Creatinine, Ser 0.72  0.50 - 1.10 mg/dL   Calcium 9.1  8.4 - 10.5 mg/dL   GFR calc non  Af Amer >90  >90 mL/min   GFR calc Af Amer >90  >90 mL/min   Anion gap 17 (*) 5 - 15   DG Chest 2 View (Final result)  Result time: 12/30/13 01:32:08     Final result by Rad Results In Interface (12/30/13 01:32:08)    Narrative:   CLINICAL DATA: Shortness of breath. History of asthma.  EXAM: CHEST 2 VIEW  COMPARISON: Chest radiograph performed 10/28/2008, and CTA of the chest performed 10/29/2008  FINDINGS: The lungs are well-aerated and clear. There is no evidence of focal opacification, pleural effusion or pneumothorax.  The heart is normal in size; the mediastinal contour is within normal limits. No acute osseous abnormalities are seen.  IMPRESSION: No acute cardiopulmonary process seen.   Electronically Signed By: Garald Balding M.D. On: 12/30/2013 01:32         MDM   DDX: bronchitis, pneumonia, pleural effusion, uncomplicated asthma excacerbation.   Patient remains tachypneic but without much wheezing following first Albutrerol neb. CXR  And bloodwork pending. Anticipate admission. Prednisone ordered along with IVF.   5009:  Patient re-evaluated. She says she is feeling better. However, remains tachycardic to 112 bpm with RR 24 to 28/min with clear lung sounds. I believe this is most likely an early pneumonia and we have obtained blood cultures and are treating with empiric abx for CAP. D-dimer has been ordered to help exclude PE in this low pretest probability patient. We will resus with a 2nd liter of NS.   Case discussed with Dr. Reece Levy who has accepted the patient for admission.   Elyn Peers, MD 12/30/13 [id number]

## 2013-12-30 NOTE — Progress Notes (Signed)
Pt signed d/c papers. Rx given. Work note given. Discussed s/sx to return for, when to call MD, new medications. All questions answered. IV removed. Will continue to monitor.

## 2014-01-02 ENCOUNTER — Ambulatory Visit (INDEPENDENT_AMBULATORY_CARE_PROVIDER_SITE_OTHER): Payer: 59 | Admitting: Physician Assistant

## 2014-01-02 ENCOUNTER — Encounter: Payer: Self-pay | Admitting: Physician Assistant

## 2014-01-02 VITALS — BP 110/70 | HR 76 | Temp 98.2°F | Resp 18 | Wt 155.0 lb

## 2014-01-02 DIAGNOSIS — J45901 Unspecified asthma with (acute) exacerbation: Secondary | ICD-10-CM

## 2014-01-02 DIAGNOSIS — J40 Bronchitis, not specified as acute or chronic: Secondary | ICD-10-CM

## 2014-01-02 MED ORDER — ALBUTEROL SULFATE HFA 108 (90 BASE) MCG/ACT IN AERS: 2.0000 | INHALATION_SPRAY | Freq: Four times a day (QID) | RESPIRATORY_TRACT | Status: DC | PRN

## 2014-01-02 MED ORDER — METHYLPREDNISOLONE ACETATE 80 MG/ML IJ SUSP
80.0000 mg | Freq: Once | INTRAMUSCULAR | Status: AC
  Administered 2014-01-02: 80 mg via INTRAMUSCULAR

## 2014-01-02 MED ORDER — PREDNISONE 20 MG PO TABS: ORAL_TABLET | ORAL | Status: DC

## 2014-01-02 NOTE — Progress Notes (Signed)
Patient ID: Gwendolyn Mitchell MRN: [id number], DOB: 1976-04-13, 38 y.o. Date of Encounter: @DATE @  Chief Complaint:  Chief Complaint  Patient presents with  . Hospital follow up    pneumonia, forms for work    HPI: 38 y.o. year old white female  presents for followup of recent hospitalization.  Discharge summary is not available yet. I do have the admission H&P available. Patient states that she went into the hospital on 12/30/13 and was discharged on 12/31/13.  The following information is from the H&P. "Patient reports that her symptoms started on 12/27/2013. Started having a sore throat then developed myalgias and cough productive of sputum. She had been wheezing at home with increased used of nebulizer and inhalers. Night prior to admission at about 9 PM temperature read 103. As a result she presented to the emergency department for further evaluation. In the emergency department she received nebulizer therapy. She also complained of pain in her lungs due to cough which was productive of greenish yellow sputum. Chest x-ray on 12/30/13 at 1:32 AM showed no acute cardiopulmonary process. Assessment and plan: Chest x-ray showed no acute process. However, Due to asthma exacerbation and bronchitis, and fact that patient was complaining of cough productive of sputum and fever, they planned to continue ceftriaxone and azithromycin which had been started in the emergency department. They planned to continue scheduled and PRN nebulizer therapy.  also noted that she had been given a dose of prednisone in the emergency department. They planned to continue prednisone 20 mg daily and continue nebulizer therapy.  Patient discharged home with Levaquin, prednisone taper, Advair, albuterol both nebulizer and inhaler.  She says that she has been using the albuterol every 3-4 hours. She is taking all the other medications as directed. She says that she still had low-grade fever night before last but has had  no fever since then. She says that she feels a lot better compared to when she went to the emergency room. However says it still hurts to take a deep breath and she is still having a lot of wheezing.  She also has forms regarding out of work. She says that she is very concerned because of where she has to park and the long distances she has to walk to get into her office building. 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 Says that she has to park in a parking deck and then walk a distance within the parking deck and down the stairs and cross 2 busy streets.   Past Medical History  Diagnosis Date  . Asthma   . Arnold-Chiari malformation   . Migraines   . Mitral valve prolapse 2012    Mild      Home Meds: Outpatient Prescriptions Prior to Visit  Medication Sig Dispense Refill  . acetaminophen (TYLENOL) 500 MG tablet Take 1,000 mg by mouth every 6 (six) hours as needed for mild pain.      Marland Kitchen Fluticasone-Salmeterol (ADVAIR) 250-50 MCG/DOSE AEPB Inhale 1 puff into the lungs 2 (two) times daily.      Marland Kitchen ibuprofen (ADVIL,MOTRIN) 200 MG tablet Take 400 mg by mouth every 6 (six) hours as needed for mild pain.      Marland Kitchen levofloxacin (LEVAQUIN) 500 MG tablet Take 1 tablet (500 mg total) by mouth daily.  6 tablet  0  . Multiple Vitamin (MULTIVITAMIN) tablet Take 1 tablet by mouth daily.      . predniSONE (DELTASONE) 10 MG tablet 4 tablets  PO on Tuesday, then decrease by 1 tablet daily until off  10 tablet  0  . albuterol (PROVENTIL HFA;VENTOLIN HFA) 108 (90 BASE) MCG/ACT inhaler Inhale 2 puffs into the lungs every 6 (six) hours as needed for wheezing or shortness of breath.  1 Inhaler  0  . guaiFENesin (MUCINEX) 600 MG 12 hr tablet Take 600 mg by mouth 2 (two) times daily as needed for cough.        No facility-administered medications prior to visit.    Allergies: No Known Allergies  History   Social History  . Marital Status: Married    Spouse Name: Gwendolyn Mitchell    Number of  Children: 3  . Years of Education: Gwendolyn Mitchell   Occupational History  . A/P    Social History Main Topics  . Smoking status: Never Smoker   . Smokeless tobacco: Never Used  . Alcohol Use: No  . Drug Use: No  . Sexual Activity: Yes    Birth Control/ Protection: Condom   Other Topics Concern  . Not on file   Social History Narrative  . No narrative on file    Family History  Problem Relation Age of Onset  . Diabetes Father   . Heart disease Father   . Skin cancer Father   . Hypertension Father   . Hyperlipidemia Father   . Rheum arthritis Brother   . Diabetes Brother   . Hyperlipidemia Brother   . Hypertension Brother      Review of Systems:  See HPI for pertinent ROS. All other ROS negative.    Physical Exam: Blood pressure 110/70, pulse 76, temperature 98.2 F (36.8 C), temperature source Oral, resp. rate 18, weight 155 lb (70.308 kg), last menstrual period 12/28/2013., Body mass index is 22.88 kg/(m^2). General: WNWD WF. Audible wheezing across the room. Appears in no acute distress. SaO2 on RA at Rest is 98 % Head: Normocephalic, atraumatic, eyes without discharge, sclera non-icteric, nares are without discharge. Bilateral auditory canals clear, TM's are without perforation, pearly grey and translucent with reflective cone of light bilaterally. Oral cavity moist, posterior pharynx without exudate, erythema, peritonsillar abscess, or post nasal drip.  Neck: Supple. No thyromegaly. No lymphadenopathy. Lungs: VERY LOUD WHEEZES HEARD THROUGHOUT BILATERALLY Heart: RRR with S1 S2. No murmurs, rubs, or gallops. Musculoskeletal:  Strength and tone normal for age. Extremities/Skin: Warm and dry. Neuro: Alert and oriented X 3. Moves all extremities spontaneously. Gait is normal. CNII-XII grossly in tact. Psych:  Responds to questions appropriately with a normal affect.     ASSESSMENT AND PLAN:  38 y.o. year old female with  1. Bronchial asthma, unspecified asthma severity,  with acute exacerbation - albuterol (PROVENTIL HFA;VENTOLIN HFA) 108 (90 BASE) MCG/ACT inhaler; Inhale 2 puffs into the lungs every 6 (six) hours as needed for wheezing or shortness of breath.  Dispense: 1 Inhaler; Refill: 3 - predniSONE (DELTASONE) 20 MG tablet; Take 3 daily for 2 days, then 2 daily for 2 days, then 1 daily for 2 days.  Dispense: 12 tablet; Refill: 0  2. Bronchitis - albuterol (PROVENTIL HFA;VENTOLIN HFA) 108 (90 BASE) MCG/ACT inhaler; Inhale 2 puffs into the lungs every 6 (six) hours as needed for wheezing or shortness of breath.  Dispense: 1 Inhaler; Refill: 3 - predniSONE (DELTASONE) 20 MG tablet; Take 3 daily for 2 days, then 2 daily for 2 days, then 1 daily for 2 days.  Dispense: 12 tablet; Refill: 0  3. Asthma, unspecified asthma severity, with acute exacerbation - albuterol (  PROVENTIL HFA;VENTOLIN HFA) 108 (90 BASE) MCG/ACT inhaler; Inhale 2 puffs into the lungs every 6 (six) hours as needed for wheezing or shortness of breath.  Dispense: 1 Inhaler; Refill: 3 - predniSONE (DELTASONE) 20 MG tablet; Take 3 daily for 2 days, then 2 daily for 2 days, then 1 daily for 2 days.  Dispense: 12 tablet; Refill: 0  4. Asthma attack - albuterol (PROVENTIL HFA;VENTOLIN HFA) 108 (90 BASE) MCG/ACT inhaler; Inhale 2 puffs into the lungs every 6 (six) hours as needed for wheezing or shortness of breath.  Dispense: 1 Inhaler; Refill: 3   Depo-Medrol 80 mg IM given here in the office. Will start the above oral prednisone taper tomorrow. She will continue all other current medications the same as she already is directed. She will schedule followup office visit with me Monday 01/06/2014. I completed a form for her work stating that she is out of work through Monday. May return to work full duty on Tuesday 01/07/14. We made a copy of blank form prior to completing today's form. Will bring that blank form with her to the office visit on Monday. If it is felt that she needs to be out of work  further, then we will complete that form on Monday.  If the symptoms worsen at all then call us or to the emergency room. Otherwise followup Monday.  Marin Olp [place], [place], [facility] 01/02/2014 10:24 AM

## 2014-01-05 LAB — CULTURE, BLOOD (ROUTINE X 2)
CULTURE: NO GROWTH
CULTURE: NO GROWTH

## 2014-01-06 ENCOUNTER — Encounter: Payer: Self-pay | Admitting: Family Medicine

## 2014-01-06 ENCOUNTER — Ambulatory Visit (INDEPENDENT_AMBULATORY_CARE_PROVIDER_SITE_OTHER): Payer: 59 | Admitting: Family Medicine

## 2014-01-06 VITALS — BP 128/60 | HR 72 | Temp 98.3°F | Resp 14 | Ht 68.75 in | Wt 154.0 lb

## 2014-01-06 DIAGNOSIS — J45901 Unspecified asthma with (acute) exacerbation: Secondary | ICD-10-CM

## 2014-01-06 NOTE — Progress Notes (Signed)
Patient ID: Gwendolyn Mitchell, female   DOB: Aug 01, 1975, 38 y.o.   MRN: [id number]   Subjective:    Patient ID: Gwendolyn Mitchell, female    DOB: 05/08/1976, 38 y.o.   MRN: [id number]  Patient presents for F/U from 7/23 Pt here to f/u asthma bronchitis, was hospitalized on 7/12-7/20, had followup in our office on 7/23 with minimal improvement, Depo Medrol was given and new prednisone taper, completed antibiotics yesterday. Using nebulizer and advair as prescribed. Today is much improved,continues to require albuterol treatments a couple times a day. 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 The prednisone is causing some side effects of jitteriness as well as insomnia it makes her feel little fuzzy headed    Review Of Systems:  GEN- denies fatigue, fever, weight loss,weakness, recent illness HEENT- denies eye drainage, change in vision, nasal discharge, CVS- denies chest pain, palpitations RESP- denies SOB, cough, wheeze ABD- denies N/V, change in stools, abd pain GU- denies dysuria, hematuria, dribbling, incontinence MSK- denies joint pain, muscle aches, injury Neuro- denies headache, dizziness, syncope, seizure activity       Objective:    BP 128/60  Pulse 72  Temp(Src) 98.3 F (36.8 C) (Oral)  Resp 14  Ht 5' 8.75" (1.746 m)  Wt 154 lb (69.854 kg)  BMI 22.91 kg/m2  LMP 12/28/2013 GEN- NAD, alert and oriented x3 HEENT- PERRL, EOMI, non injected sclera, pink conjunctiva, MMM, oropharynx clear Neck- Supple, no LAD CVS- RRR, no murmur RESP-good air movement no wheeze Pulses- Radial 2+        Assessment & Plan:      Problem List Items Addressed This Visit   None      Note: This dictation was prepared with Dragon dictation along with smaller phrase technology. Any transcriptional errors that result from this process are unintentional.

## 2014-01-06 NOTE — Patient Instructions (Signed)
Continue current medications Return to work August 3rd. F/U as needed
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
## 2014-01-13 ENCOUNTER — Ambulatory Visit: Payer: 59 | Admitting: Family Medicine

## 2014-04-11 ENCOUNTER — Ambulatory Visit (HOSPITAL_COMMUNITY)
Admission: RE | Admit: 2014-04-11 | Discharge: 2014-04-11 | Disposition: A | Discharge status: Home / Self Care | Payer: 59 | Source: Ambulatory Visit | Attending: Internal Medicine | Admitting: Internal Medicine

## 2014-04-11 DIAGNOSIS — R1011 Right upper quadrant pain: Secondary | ICD-10-CM

## 2014-04-11 DIAGNOSIS — K769 Liver disease, unspecified: Secondary | ICD-10-CM | POA: Insufficient documentation

## 2014-04-12 ENCOUNTER — Encounter: Payer: Self-pay | Admitting: Internal Medicine

## 2014-04-12 NOTE — Progress Notes (Signed)
Quick Note:  Notified through My Chart No f/u of hemangioma needed See me prn ______

## 2014-04-15 ENCOUNTER — Telehealth: Payer: Self-pay | Admitting: Internal Medicine

## 2014-04-15 NOTE — Telephone Encounter (Signed)
I have left a detailed message that results are in my chart and that he sent her a message. I did read her the message.  I have asked that if she has any questions to call back or message [place] through my chart.

## 2014-05-19 ENCOUNTER — Other Ambulatory Visit: Payer: Self-pay | Admitting: Obstetrics and Gynecology

## 2014-05-20 LAB — CYTOLOGY - PAP

## 2014-06-03 ENCOUNTER — Other Ambulatory Visit: Payer: Self-pay | Admitting: Obstetrics and Gynecology

## 2014-07-16 ENCOUNTER — Other Ambulatory Visit: Payer: Self-pay | Admitting: Obstetrics and Gynecology

## 2014-08-05 ENCOUNTER — Encounter: Payer: Self-pay | Admitting: Family Medicine

## 2014-08-08 ENCOUNTER — Telehealth: Payer: Self-pay | Admitting: Family Medicine

## 2014-08-08 DIAGNOSIS — J45901 Unspecified asthma with (acute) exacerbation: Secondary | ICD-10-CM

## 2014-08-08 DIAGNOSIS — J40 Bronchitis, not specified as acute or chronic: Secondary | ICD-10-CM

## 2014-08-08 MED ORDER — ALBUTEROL SULFATE HFA 108 (90 BASE) MCG/ACT IN AERS: 2.0000 | INHALATION_SPRAY | Freq: Four times a day (QID) | RESPIRATORY_TRACT | Status: AC | PRN

## 2014-08-08 NOTE — Telephone Encounter (Signed)
Patient is calling to get refill on her albuterol  [phone] [facility]

## 2014-08-08 NOTE — Telephone Encounter (Signed)
Med sent to pharm 

## 2014-08-30 ENCOUNTER — Ambulatory Visit (INDEPENDENT_AMBULATORY_CARE_PROVIDER_SITE_OTHER): Payer: 59 | Admitting: Emergency Medicine

## 2014-08-30 VITALS — BP 110/64 | HR 70 | Temp 98.0°F | Resp 16 | Ht 69.0 in | Wt 156.4 lb

## 2014-08-30 DIAGNOSIS — J0140 Acute pansinusitis, unspecified: Secondary | ICD-10-CM | POA: Diagnosis not present

## 2014-08-30 MED ORDER — AMOXICILLIN-POT CLAVULANATE 875-125 MG PO TABS: 1.0000 | ORAL_TABLET | Freq: Two times a day (BID) | ORAL | Status: DC

## 2014-08-30 MED ORDER — PSEUDOEPHEDRINE-GUAIFENESIN ER 60-600 MG PO TB12: 1.0000 | ORAL_TABLET | Freq: Two times a day (BID) | ORAL | Status: DC

## 2014-08-30 NOTE — Patient Instructions (Signed)

## 2014-08-30 NOTE — Progress Notes (Signed)
[facility] [address] [place] [address] [phone]  Date:  08/30/2014   Name:  Gwendolyn Mitchell   DOB:  1976-03-23   MRN:  [id number]  PCP:  Odette Fraction, MD    Chief Complaint: Headache; Sinusitis; Sore Throat; and Otalgia   History of Present Illness:  Gwendolyn Mitchell is a 39 y.o. very pleasant female patient who presents with the following:  Ill this week with nasal congestion and pressure Purulent drainage and post nasal drip Sore throat No cough, wheezing or shortness of breath No nausea or vomiting. No fever some chills No improvement with over the counter medications or other home remedies.  Denies other complaint or health concern today.   Patient Active Problem List   Diagnosis Date Noted  . Bronchial asthma 12/30/2013  . Hemangioma of liver - right lobe 12/17/2013  . MVP (mitral valve prolapse) 03/13/2013  . Asthma   . ABNORMAL PAP SMEAR, LGSIL 03/05/2007  . ARNOLD-CHIARI MALFORMATION 01/25/2007  . HEADACHE, CHRONIC 01/25/2007    Past Medical History  Diagnosis Date  . Asthma   . Arnold-Chiari malformation   . Migraines   . Mitral valve prolapse 2012    Mild   . Hemangioma of liver - right lobe 12/17/2013    History reviewed. No pertinent past surgical history.  History  Substance Use Topics  . Smoking status: Never Smoker   . Smokeless tobacco: Never Used  . Alcohol Use: No    Family History  Problem Relation Age of Onset  . Diabetes Father   . Heart disease Father   . Skin cancer Father   . Hypertension Father   . Hyperlipidemia Father   . Rheum arthritis Brother   . Diabetes Brother   . Hyperlipidemia Brother   . Hypertension Brother     No Known Allergies  Medication list has been reviewed and updated.  Current Outpatient Prescriptions on File Prior to Visit  Medication Sig Dispense Refill  . acetaminophen (TYLENOL) 500 MG tablet Take 1,000 mg by mouth every 6 (six) hours as needed for mild pain.     Marland Kitchen albuterol (PROVENTIL HFA;VENTOLIN HFA) 108 (90 BASE) MCG/ACT inhaler Inhale 2 puffs into the lungs every 6 (six) hours as needed for wheezing or shortness of breath. (Patient not taking: Reported on 08/30/2014) 1 Inhaler 3  . Fluticasone-Salmeterol (ADVAIR) 250-50 MCG/DOSE AEPB Inhale 1 puff into the lungs 2 (two) times daily.    Marland Kitchen guaiFENesin (MUCINEX) 600 MG 12 hr tablet Take 600 mg by mouth 2 (two) times daily as needed for cough.     Marland Kitchen ibuprofen (ADVIL,MOTRIN) 200 MG tablet Take 400 mg by mouth every 6 (six) hours as needed for mild pain.    . Multiple Vitamin (MULTIVITAMIN) tablet Take 1 tablet by mouth daily.    . predniSONE (DELTASONE) 20 MG tablet Take 3 daily for 2 days, then 2 daily for 2 days, then 1 daily for 2 days. (Patient not taking: Reported on 08/30/2014) 12 tablet 0   No current facility-administered medications on file prior to visit.    Review of Systems:  As per HPI, otherwise negative.    Physical Examination: Filed Vitals:   08/30/14 1503  BP: 110/64  Pulse: 70  Temp: 98 F (36.7 C)  Resp: 16   Filed Vitals:   08/30/14 1503  Height: 5\' 9"  (1.753 m)  Weight: 156 lb 6.4 oz (70.943 kg)   Body mass index is 23.09 kg/(m^2). Ideal Body  Weight: Weight in (lb) to have BMI = 25: 168.9  GEN: WDWN, NAD, Non-toxic, A & O x 3 HEENT: Atraumatic, Normocephalic. Neck supple. No masses, No LAD. Ears and Nose: No external deformity. CV: RRR, No M/G/R. No JVD. No thrill. No extra heart sounds. PULM: CTA B, no wheezes, crackles, rhonchi. No retractions. No resp. distress. No accessory muscle use. ABD: S, NT, ND, +BS. No rebound. No HSM. EXTR: No c/c/e NEURO Normal gait.  PSYCH: Normally interactive. Conversant. Not depressed or anxious appearing.  Calm demeanor.    Assessment and Plan: Sinusitis mucinex augmentin   Signed,  Ellison Carwin, MD

## 2014-09-02 ENCOUNTER — Encounter: Payer: Self-pay | Admitting: Family Medicine

## 2014-11-02 ENCOUNTER — Ambulatory Visit (INDEPENDENT_AMBULATORY_CARE_PROVIDER_SITE_OTHER): Payer: 59 | Admitting: Family Medicine

## 2014-11-02 VITALS — BP 102/74 | HR 80 | Temp 98.9°F | Resp 16 | Ht 69.5 in | Wt 157.0 lb

## 2014-11-02 DIAGNOSIS — G4452 New daily persistent headache (NDPH): Secondary | ICD-10-CM

## 2014-11-02 DIAGNOSIS — G4700 Insomnia, unspecified: Secondary | ICD-10-CM | POA: Diagnosis not present

## 2014-11-02 DIAGNOSIS — R252 Cramp and spasm: Secondary | ICD-10-CM

## 2014-11-02 DIAGNOSIS — T148 Other injury of unspecified body region: Secondary | ICD-10-CM | POA: Diagnosis not present

## 2014-11-02 DIAGNOSIS — W57XXXA Bitten or stung by nonvenomous insect and other nonvenomous arthropods, initial encounter: Secondary | ICD-10-CM

## 2014-11-02 LAB — COMPLETE METABOLIC PANEL WITH GFR
ALT: 10 U/L (ref 0–35)
AST: 13 U/L (ref 0–37)
Albumin: 4.6 g/dL (ref 3.5–5.2)
Alkaline Phosphatase: 61 U/L (ref 39–117)
BUN: 9 mg/dL (ref 6–23)
CO2: 27 mEq/L (ref 19–32)
Calcium: 9.4 mg/dL (ref 8.4–10.5)
Chloride: 101 mEq/L (ref 96–112)
Creat: 0.68 mg/dL (ref 0.50–1.10)
GFR, Est African American: >89 mL/min
GFR, Est Non African American: >89 mL/min
Glucose, Bld: 85 mg/dL (ref 70–99)
Potassium: 4.3 mEq/L (ref 3.5–5.3)
Sodium: 137 mEq/L (ref 135–145)
Total Bilirubin: 0.3 mg/dL (ref 0.2–1.2)
Total Protein: 7.6 g/dL (ref 6.0–8.3)

## 2014-11-02 LAB — POCT CBC
Granulocyte percent: 68.9 %G (ref 37–80)
HCT, POC: 43.3 % (ref 37.7–47.9)
Hemoglobin: 13.9 g/dL (ref 12.2–16.2)
Lymph, poc: 1.6 (ref 0.6–3.4)
MCH, POC: 27.2 pg (ref 27–31.2)
MCHC: 32 g/dL (ref 31.8–35.4)
MCV: 84.9 fL (ref 80–97)
MID (cbc): 0.5 (ref 0–0.9)
MPV: 7.3 fL (ref 0–99.8)
POC Granulocyte: 4.5 (ref 2–6.9)
POC LYMPH PERCENT: 24.2 %L (ref 10–50)
POC MID %: 6.9 %M (ref 0–12)
Platelet Count, POC: 309 10*3/uL (ref 142–424)
RBC: 5.1 M/uL (ref 4.04–5.48)
RDW, POC: 13.8 %
WBC: 6.6 10*3/uL (ref 4.6–10.2)

## 2014-11-02 LAB — POCT SEDIMENTATION RATE: POCT SED RATE: 19 mm/hr (ref 0–22)

## 2014-11-02 MED ORDER — DOXYCYCLINE HYCLATE 100 MG PO TABS: 100.0000 mg | ORAL_TABLET | Freq: Two times a day (BID) | ORAL | Status: DC

## 2014-11-02 MED ORDER — LORAZEPAM 0.5 MG PO TABS: 0.5000 mg | ORAL_TABLET | Freq: Every day | ORAL | Status: DC

## 2014-11-02 NOTE — Patient Instructions (Signed)
I'll call you with lab results in a couple days
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
## 2014-11-03 LAB — B. BURGDORFI ANTIBODIES: B burgdorferi Ab IgG+IgM: 0.53 {ISR}

## 2014-11-04 LAB — ROCKY MTN SPOTTED FVR AB, IGM-BLOOD: ROCKY MTN SPOTTED FEVER, IGM: 1.07 IV — ABNORMAL HIGH

## 2014-11-07 ENCOUNTER — Ambulatory Visit (INDEPENDENT_AMBULATORY_CARE_PROVIDER_SITE_OTHER): Payer: 59 | Admitting: Family Medicine

## 2014-11-07 VITALS — BP 110/84 | HR 69 | Temp 98.4°F | Resp 16 | Ht 69.5 in | Wt 158.0 lb

## 2014-11-07 DIAGNOSIS — R51 Headache: Secondary | ICD-10-CM | POA: Diagnosis not present

## 2014-11-07 DIAGNOSIS — R110 Nausea: Secondary | ICD-10-CM

## 2014-11-07 DIAGNOSIS — R519 Headache, unspecified: Secondary | ICD-10-CM

## 2014-11-07 DIAGNOSIS — A770 Spotted fever due to Rickettsia rickettsii: Secondary | ICD-10-CM | POA: Diagnosis not present

## 2014-11-07 MED ORDER — PROMETHAZINE HCL 12.5 MG PO TABS: 12.5000 mg | ORAL_TABLET | Freq: Two times a day (BID) | ORAL | Status: DC | PRN

## 2014-11-07 NOTE — Progress Notes (Signed)
° °  Subjective:  This chart was scribed for Gwendolyn Haber MD, by Tamsen Roers, at [facility].  This patient was seen in room 4 and the patient's care was started at 1:29 PM.    Patient ID: Gwendolyn Mitchell, female    DOB: May 06, 1976, 39 y.o.   MRN: [id number] Chief Complaint  Patient presents with   Headache    Onset 1 week   Nausea   Generalized Body Aches    HPI  HPI Comments: Gwendolyn Mitchell is a 39 y.o. female who [facility] complaining of a worsening frontal headache since her last visit (onset 1 week ago), nausea, body aches and sore throat.  She has not missed any work and states that she has not been able to sleep recently.  She denies any fevers/chills.  She has no other complaints today.     Past Medical History  Diagnosis Date   Asthma    Arnold-Chiari malformation    Migraines    Mitral valve prolapse 2012    Mild    Hemangioma of liver - right lobe 12/17/2013    Current Outpatient Prescriptions on File Prior to Visit  Medication Sig Dispense Refill   albuterol (PROVENTIL HFA;VENTOLIN HFA) 108 (90 BASE) MCG/ACT inhaler Inhale 2 puffs into the lungs every 6 (six) hours as needed for wheezing or shortness of breath. (Patient not taking: Reported on 11/07/2014) 1 Inhaler 3   doxycycline (VIBRA-TABS) 100 MG tablet Take 1 tablet (100 mg total) by mouth 2 (two) times daily. 20 tablet 0   LORazepam (ATIVAN) 0.5 MG tablet Take 1 tablet (0.5 mg total) by mouth at bedtime. 7 tablet 0   No current facility-administered medications on file prior to visit.    No Known Allergies     Review of Systems  Constitutional: Negative for fever and chills.  HENT: Positive for sore throat.   Eyes: Negative for pain, discharge and itching.  Gastrointestinal: Positive for nausea. Negative for vomiting.  Musculoskeletal: Negative for neck pain and neck stiffness.  Neurological: Positive for headaches. Negative for  speech difficulty.       Objective:   Physical Exam  Constitutional: She is oriented to person, place, and time. She appears well-developed and well-nourished. No distress.  HENT:  Head: Normocephalic and atraumatic.  Eyes: Conjunctivae and EOM are normal.  Cardiovascular: Normal rate.   Pulmonary/Chest: Effort normal. No respiratory distress.  Musculoskeletal: Normal range of motion.  Neurological: She is alert and oriented to person, place, and time.  Skin: Skin is warm and dry.  Psychiatric: She has a normal mood and affect. Her behavior is normal.  Nursing note and vitals reviewed.   Filed Vitals:   11/07/14 1312  BP: 110/84  Pulse: 69  Temp: 98.4 F (36.9 C)  TempSrc: Oral  Resp: 16  Height: 5' 9.5" (1.765 m)  Weight: 158 lb (71.668 kg)  SpO2: 99%      Assessment & Plan:   This chart was scribed in my presence and reviewed by me personally.    ICD-9-CM ICD-10-CM   1. Nausea without vomiting 787.02 R11.0 promethazine (PHENERGAN) 12.5 MG tablet  2. Headache above the eye region 784.0 R51   3. 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  Signed, Gwendolyn Haber, MD

## 2014-11-07 NOTE — Patient Instructions (Signed)
Results for orders placed or performed in visit on 11/02/14  COMPLETE METABOLIC PANEL WITH GFR  Result Value Ref Range   Sodium 137 135 - 145 mEq/L   Potassium 4.3 3.5 - 5.3 mEq/L   Chloride 101 96 - 112 mEq/L   CO2 27 19 - 32 mEq/L   Glucose, Bld 85 70 - 99 mg/dL   BUN 9 6 - 23 mg/dL   Creat 0.68 0.50 - 1.10 mg/dL   Total Bilirubin 0.3 0.2 - 1.2 mg/dL   Alkaline Phosphatase 61 39 - 117 U/L   AST 13 0 - 37 U/L   ALT 10 0 - 35 U/L   Total Protein 7.6 6.0 - 8.3 g/dL   Albumin 4.6 3.5 - 5.2 g/dL   Calcium 9.4 8.4 - 10.5 mg/dL   GFR, Est African American >89 mL/min   GFR, Est Non African American >89 mL/min  Rocky mtn spotted fvr ab, IgM-blood  Result Value Ref Range   ROCKY MTN SPOTTED FEVER, IGM 1.07 (H) IV  B. Burgdorfi Antibodies  Result Value Ref Range   B burgdorferi Ab IgG+IgM 0.53 ISR  POCT CBC  Result Value Ref Range   WBC 6.6 4.6 - 10.2 K/uL   Lymph, poc 1.6 0.6 - 3.4   POC LYMPH PERCENT 24.2 10 - 50 %L   MID (cbc) 0.5 0 - 0.9   POC MID % 6.9 0 - 12 %M   POC Granulocyte 4.5 2 - 6.9   Granulocyte percent 68.9 37 - 80 %G   RBC 5.10 4.04 - 5.48 M/uL   Hemoglobin 13.9 12.2 - 16.2 g/dL   HCT, POC 43.3 37.7 - 47.9 %   MCV 84.9 80 - 97 fL   MCH, POC 27.2 27 - 31.2 pg   MCHC 32.0 31.8 - 35.4 g/dL   RDW, POC 13.8 %   Platelet Count, POC 309 142 - 424 K/uL   MPV 7.3 0 - 99.8 fL  POCT SEDIMENTATION RATE  Result Value Ref Range   POCT SED RATE 19 0 - 22 mm/hr     [place] Spotted Fever [place] Spotted Fever (RMSF) is the oldest known tick-borne disease of people in the [place]. This disease was named because it was first described among people in the [facility] area who had an illness characterized by a rash with red-purple-black spots. This disease is caused by a rickettsia (Rickettsia rickettsii), a bacteria carried by the tick. The [facility] wood tick and the American dog tick acquire and transmit the RMSF bacteria (pictures NOT actual  size). When a larval, nymphal, or adult tick feeds on an infected rodent or larger animal, the tick can become infected. Infected adult ticks then feed on people who may then get RMSF. The tick transmits the disease to humans during a prolonged period of feeding that lasts many hours, days, or even a couple weeks. The bite is painless and frequently goes unnoticed. An infected female tick may also pass the rickettsial bacteria to her eggs that then may mature to be infected adult ticks. The rickettsia that causes RMSF can also get into a person's body through damaged skin. A tick bite is not necessary. People can get RMSF if they crush a tick and get its blood or body fluids on their skin through a small cut or sore.  DIAGNOSIS Diagnosis is made by laboratory tests.  TREATMENT Treatment is with antibiotics (medications that kill rickettsia and other bacteria). Immediate treatment usually prevents death.  GEOGRAPHIC RANGE This disease was reported only in the [facility] until 1931. RMSF has more recently been described among individuals in all states except [place], [place], and [place]. The highest reported incidences of RMSF now occur among residents of [place], [place], [place], and the [place]. TIME OF YEAR  Most cases are diagnosed during late spring and summer when ticks are most active. However, especially in the warmer [place] states, a few cases occur during the winter. SYMPTOMS   Symptoms of RMSF begin from 2 to 14 days after a tick bite. The most common early symptoms are fever, muscle aches, and headache followed by nausea (feeling sick to your stomach) or vomiting.  The RMSF rash is typically delayed until 3 or more days after symptom onset, and eventually develops in 9 of 10 infected patients by the fifth day of illness. If the disease is not treated it can cause death. If you get a fever, headache, muscle aches, rash, nausea, or vomiting within 2 weeks of a possible tick bite or  exposure, you should see your caregiver immediately. PREVENTION Ticks prefer to hide in shady, moist ground litter. They can often be found above the ground clinging to tall grass, brush, shrubs and low tree branches. They also inhabit lawns and gardens, especially at the edges of woodlands and around old stone walls. Within the areas where ticks generally live, no naturally vegetated area can be considered completely free of infected ticks. The best precaution against RMSF is to avoid contact with soil, leaf litter, and vegetation as much as possible in tick-infested areas. For those who enjoy gardening or walking in their yards, clear brush and mow tall grass around houses and at the edges of gardens. This may help reduce the tick population in the immediate area. Applications of chemical insecticides by a licensed professional in the spring (late May) and fall (September) will also control ticks, especially in heavily infested areas. Treatment will never get rid of all the ticks. Getting rid of small animal populations that host ticks will also decrease the tick population. When working in the garden, [employer], or handling soil and vegetation, wear light-colored protective clothing and gloves. Spot-check often to prevent ticks from reaching the skin. Ticks cannot jump or fly. They will not drop from an above-ground perch onto a passing animal. Once a tick gains access to human skin it climbs upward until it reaches a more protected area. For example, the back of the knee, groin, navel, armpit, ears, or nape of the neck. It then begins the slow process of embedding itself in the skin. Campers, hikers, field workers, and others who spend time in wooded, brushy, or tall grassy areas can avoid exposure to ticks by using the following precautions:  Wear light-colored clothing with a tight weave to spot ticks more easily and prevent contact with the skin.  Wear long pants tucked into socks, long-sleeved  shirts tucked into pants and enclosed shoes or boots along with insect repellent.  Spray clothes with insect repellent containing either DEET or Permethrin. Only DEET can be used on exposed skin. Follow the manufacturer's directions carefully.  Wear a hat and keep long hair pulled back.  Stay on cleared, well-worn trails whenever possible.  Spot-check yourself and others often for the presence of ticks on clothes. If you find one, there are likely to be others. Check thoroughly.  Remove clothes after leaving tick-infested areas. If possible, wash them to eliminate any unseen ticks. Check yourself, your children and any  pets from head to toe for the presence of ticks.  Shower and shampoo. You can greatly reduce your chances of contracting RMSF if you remove attached ticks as soon as possible. Regular checks of the body, including all body sites covered by hair (head, armpits, genitals), allow removal of the tick before rickettsial transmission. To remove an attached tick, use a forceps or tweezers to detach the intact tick without leaving mouth parts in the skin. The tick bite wound should be cleansed after tick removal. Remember the most common symptoms of RMSF are fever, muscle aches, headache, and nausea or vomiting with a later onset of rash. If you get these symptoms after a tick bite and while living in an area where RMSF is found, RMSF should be suspected. If the disease is not treated, it can cause death. See your caregiver immediately if you get these symptoms. Do this even if not aware of a tick bite. Document Released: 09/11/2000 Document Revised: 10/14/2013 Document Reviewed: 05/04/2009 [facility] Patient Information 2015 [place], [place]. This information is not intended to replace advice given to you by your health care provider. Make sure you discuss any questions you have with your health care provider.

## 2014-11-13 ENCOUNTER — Other Ambulatory Visit: Payer: Self-pay | Admitting: Family Medicine

## 2014-11-13 ENCOUNTER — Telehealth: Payer: Self-pay

## 2014-11-13 NOTE — Telephone Encounter (Signed)
Should pt rtc? She wants a refill of doxy.

## 2014-11-13 NOTE — Telephone Encounter (Signed)
Patient requesting another prescription for "Doxycyline 100 mg". She was prescribed this medication by Dr Joseph Art on 11/02/14 for a tick bite. Per patient she was feeling better but started with a rash again today. She is having dizziness and headaches. Patient is requesting [facility] to [facility] to [facility] [address]. Patients call back number is [phone]

## 2014-11-14 ENCOUNTER — Other Ambulatory Visit: Payer: Self-pay | Admitting: Physician Assistant

## 2014-11-14 DIAGNOSIS — A770 Spotted fever due to Rickettsia rickettsii: Secondary | ICD-10-CM

## 2014-11-14 MED ORDER — DOXYCYCLINE HYCLATE 100 MG PO CAPS: 100.0000 mg | ORAL_CAPSULE | Freq: Two times a day (BID) | ORAL | Status: DC

## 2014-11-14 NOTE — Telephone Encounter (Signed)
Pt.notified

## 2014-11-14 NOTE — Progress Notes (Unsigned)
Patient with continuing symptoms after finishing treatment.  RMSF titer returned and is high.  Will add another 20 days of abx therapy. Philis Fendt, MS, PA-C   8:37 AM, 11/14/2014

## 2014-11-14 NOTE — Telephone Encounter (Signed)
Doxy sent to pharmacy by Philis Fendt, PA-C.

## 2014-11-17 ENCOUNTER — Encounter: Payer: Self-pay | Admitting: Family Medicine

## 2014-11-17 ENCOUNTER — Ambulatory Visit (INDEPENDENT_AMBULATORY_CARE_PROVIDER_SITE_OTHER): Payer: 59 | Admitting: Family Medicine

## 2014-11-17 VITALS — BP 100/70 | HR 74 | Temp 98.2°F | Resp 16 | Wt 157.0 lb

## 2014-11-17 DIAGNOSIS — T148 Other injury of unspecified body region: Secondary | ICD-10-CM | POA: Diagnosis not present

## 2014-11-17 DIAGNOSIS — W57XXXA Bitten or stung by nonvenomous insect and other nonvenomous arthropods, initial encounter: Secondary | ICD-10-CM | POA: Diagnosis not present

## 2014-11-17 LAB — COMPLETE METABOLIC PANEL WITH GFR
ALBUMIN: 4.3 g/dL (ref 3.5–5.2)
ALT: 9 U/L (ref 0–35)
AST: 14 U/L (ref 0–37)
Alkaline Phosphatase: 51 U/L (ref 39–117)
BILIRUBIN TOTAL: 0.4 mg/dL (ref 0.2–1.2)
BUN: 8 mg/dL (ref 6–23)
CO2: 23 mEq/L (ref 19–32)
Calcium: 9.3 mg/dL (ref 8.4–10.5)
Chloride: 103 mEq/L (ref 96–112)
Creat: 0.7 mg/dL (ref 0.50–1.10)
GFR, Est Non African American: >89 mL/min
Glucose, Bld: 86 mg/dL (ref 70–99)
POTASSIUM: 4.4 meq/L (ref 3.5–5.3)
SODIUM: 138 meq/L (ref 135–145)
Total Protein: 7 g/dL (ref 6.0–8.3)

## 2014-11-17 LAB — CBC WITH DIFFERENTIAL/PLATELET
BASOS ABS: 0.1 10*3/uL (ref 0.0–0.1)
Basophils Relative: 1 % (ref 0–1)
EOS PCT: 6 % — AB (ref 0–5)
Eosinophils Absolute: 0.5 10*3/uL (ref 0.0–0.7)
HCT: 40 % (ref 36.0–46.0)
HEMOGLOBIN: 13.5 g/dL (ref 12.0–15.0)
Lymphocytes Relative: 24 % (ref 12–46)
Lymphs Abs: 1.8 10*3/uL (ref 0.7–4.0)
MCH: 28.3 pg (ref 26.0–34.0)
MCHC: 33.8 g/dL (ref 30.0–36.0)
MCV: 83.9 fL (ref 78.0–100.0)
MPV: 9.6 fL (ref 8.6–12.4)
Monocytes Absolute: 0.7 10*3/uL (ref 0.1–1.0)
Monocytes Relative: 9 % (ref 3–12)
NEUTROS ABS: 4.5 10*3/uL (ref 1.7–7.7)
Neutrophils Relative %: 60 % (ref 43–77)
PLATELETS: 289 10*3/uL (ref 150–400)
RBC: 4.77 MIL/uL (ref 3.87–5.11)
RDW: 13.2 % (ref 11.5–15.5)
WBC: 7.5 10*3/uL (ref 4.0–10.5)

## 2014-11-17 MED ORDER — TRIAMCINOLONE ACETONIDE 0.1 % EX CREA: 1.0000 "application " | TOPICAL_CREAM | Freq: Two times a day (BID) | CUTANEOUS | Status: DC

## 2014-11-18 LAB — B. BURGDORFI ANTIBODIES BY WB
B BURGDORFERI IGM ABS (IB): NEGATIVE
B burgdorferi IgG Abs (IB): NEGATIVE

## 2014-11-18 LAB — ROCKY MTN SPOTTED FVR ABS PNL(IGG+IGM)
RMSF IGM: 0.75 IV
RMSF IgG: 0.15 IV

## 2014-11-18 NOTE — Progress Notes (Signed)
Subjective:    Patient ID: Gwendolyn Mitchell, female    DOB: 12-31-1975, 39 y.o.   MRN: [id number]  HPI Has been bitten by numerous ticks this summer. In late May, the patient developed fever headache and diffuse myalgias. She went to urgent care where she was presumptively diagnosed with tickborne illness and was started on 10 days of doxycycline. Lab work was obtained which showed equivocal IgM for [facility] spotted fever and negative IgM for Lyme disease. She completed 10 days of doxycycline and improved over 95%. However shortly after discontinuing the doxycycline, her symptoms returned. She complains of headache which is dull and occipital in location, diffuse myalgias, fatigue, she has a rash underneath her arms it is localized just underneath her arms. She denies any fever. She denies any cough sore throat nausea vomiting diarrhea ear pain or sinus pain. She does complain of stiffness in her joints including her hands her wrists or elbows and her knees. She was instructed to resume doxycycline for a total of 20 days. She's been on 3 days of doxycycline now and she is here for a second opinion. Past Medical History  Diagnosis Date  . Asthma   . Arnold-Chiari malformation   . Migraines   . Mitral valve prolapse 2012    Mild   . Hemangioma of liver - right lobe 12/17/2013   No past surgical history on file. Current Outpatient Prescriptions on File Prior to Visit  Medication Sig Dispense Refill  . albuterol (PROVENTIL HFA;VENTOLIN HFA) 108 (90 BASE) MCG/ACT inhaler Inhale 2 puffs into the lungs every 6 (six) hours as needed for wheezing or shortness of breath. 1 Inhaler 3  . doxycycline (VIBRAMYCIN) 100 MG capsule Take 1 capsule (100 mg total) by mouth 2 (two) times daily. 40 capsule 0  . LORazepam (ATIVAN) 0.5 MG tablet Take 1 tablet (0.5 mg total) by mouth at bedtime. 7 tablet 0  . promethazine (PHENERGAN) 12.5 MG tablet Take 1 tablet (12.5 mg total) by mouth every 12 (twelve) hours as  needed for nausea or vomiting. 20 tablet 0   No current facility-administered medications on file prior to visit.   No Known Allergies History   Social History  . Marital Status: Married    Spouse Name: N/A  . Number of Children: 3  . Years of Education: N/A   Occupational History  . A/P    Social History Main Topics  . Smoking status: Never Smoker   . Smokeless tobacco: Never Used  . Alcohol Use: No  . Drug Use: No  . Sexual Activity: Yes    Birth Control/ Protection: Condom   Other Topics Concern  . Not on file   Social History Narrative      Review of Systems  All other systems reviewed and are negative.      Objective:   Physical Exam  Cardiovascular: Normal rate, regular rhythm, normal heart sounds and intact distal pulses.   No murmur heard. Pulmonary/Chest: Effort normal and breath sounds normal. No respiratory distress. She has no wheezes. She has no rales. She exhibits no tenderness.  Abdominal: Soft. Bowel sounds are normal. She exhibits no distension and no mass. There is no tenderness. There is no rebound and no guarding.  Musculoskeletal: Normal range of motion. She exhibits no edema.       Right shoulder: Normal.       Left shoulder: Normal.       Right elbow: Normal.      Left elbow:  Normal.       Right wrist: Normal.       Left wrist: Normal.       Right knee: Normal.       Left knee: Normal.  Skin: Rash noted.  Vitals reviewed.   Has a red papular rash in both axilla limited to the axilla      Assessment & Plan:  Tick bite - Plan: CBC with Differential/Platelet, COMPLETE METABOLIC PANEL WITH GFR, [place] spotted fvr abs pnl(IgG+IgM), B. burgdorfi antibodies by WB  Patient may have Lyme disease. I will repeat tickborne titers. I recommended the patient complete the doxycycline to cover for possible Lyme disease. If symptoms do not improve, I would then begin a workup for possible autoimmune diseases versus fibromyalgia. I believe the  rash in her armpit is a contact dermatitis and I will treat this with triamcinolone cream

## 2014-11-20 ENCOUNTER — Encounter: Payer: Self-pay | Admitting: Family Medicine

## 2015-01-02 ENCOUNTER — Ambulatory Visit (INDEPENDENT_AMBULATORY_CARE_PROVIDER_SITE_OTHER): Payer: 59 | Admitting: Family Medicine

## 2015-01-02 VITALS — BP 116/72 | HR 88 | Temp 98.1°F | Resp 18 | Ht 69.0 in | Wt 163.0 lb

## 2015-01-02 DIAGNOSIS — M79605 Pain in left leg: Secondary | ICD-10-CM | POA: Diagnosis not present

## 2015-01-02 DIAGNOSIS — R51 Headache: Secondary | ICD-10-CM

## 2015-01-02 DIAGNOSIS — R519 Headache, unspecified: Secondary | ICD-10-CM

## 2015-01-02 DIAGNOSIS — R202 Paresthesia of skin: Secondary | ICD-10-CM | POA: Diagnosis not present

## 2015-01-02 LAB — POCT CBC
Granulocyte percent: 64.1 %G (ref 37–80)
HCT, POC: 40.8 % (ref 37.7–47.9)
Hemoglobin: 13.5 g/dL (ref 12.2–16.2)
Lymph, poc: 2 (ref 0.6–3.4)
MCH, POC: 27.7 pg (ref 27–31.2)
MCHC: 33 g/dL (ref 31.8–35.4)
MCV: 83.8 fL (ref 80–97)
MID (cbc): 0.5 (ref 0–0.9)
MPV: 7.1 fL (ref 0–99.8)
POC Granulocyte: 4.6 (ref 2–6.9)
POC LYMPH PERCENT: 28.2 %L (ref 10–50)
POC MID %: 7.7 %M (ref 0–12)
Platelet Count, POC: 299 10*3/uL (ref 142–424)
RBC: 4.87 M/uL (ref 4.04–5.48)
RDW, POC: 12.9 %
WBC: 7.1 10*3/uL (ref 4.6–10.2)

## 2015-01-02 LAB — COMPLETE METABOLIC PANEL WITH GFR
ALT: 8 U/L (ref 0–35)
AST: 13 U/L (ref 0–37)
Albumin: 4.6 g/dL (ref 3.5–5.2)
Alkaline Phosphatase: 62 U/L (ref 39–117)
BUN: 7 mg/dL (ref 6–23)
CO2: 25 mEq/L (ref 19–32)
Calcium: 9.1 mg/dL (ref 8.4–10.5)
Chloride: 102 mEq/L (ref 96–112)
Creat: 0.68 mg/dL (ref 0.50–1.10)
GFR, Est African American: >89 mL/min
GFR, Est Non African American: >89 mL/min
Glucose, Bld: 89 mg/dL (ref 70–99)
Potassium: 4.4 mEq/L (ref 3.5–5.3)
Sodium: 138 mEq/L (ref 135–145)
Total Bilirubin: 0.2 mg/dL (ref 0.2–1.2)
Total Protein: 7.5 g/dL (ref 6.0–8.3)

## 2015-01-02 LAB — POCT SEDIMENTATION RATE: POCT SED RATE: 19 mm/hr (ref 0–22)

## 2015-01-02 NOTE — Patient Instructions (Signed)
Initial labs are not indicating a serious infection. I've got several outstanding tests that are pending and will call you when they're back. At this point, there is no obvious medicine that can help you. If tests are negative and do not getting better, I'll have you see the neurologist.

## 2015-01-02 NOTE — Progress Notes (Addendum)
This chart was scribed for Gwendolyn Haber, MD by Moises Blood, medical scribe at [facility].The patient was seen in exam room 4 and the patient's care was started at 1:02 PM.  Patient ID: Gwendolyn Mitchell MRN: [id number], DOB: 12-01-1975, 39 y.o. Date of Encounter: 01/02/2015  Primary Physician: Odette Fraction, MD  Chief Complaint:  Chief Complaint  Patient presents with  . Dizziness    x1 week getting worse   . Numbness    left side     HPI:  Gwendolyn Mitchell is a 39 y.o. female who presents to [facility] complaining of dizziness with associated left side numbness that started a week ago.  Pt states that she has pains in her left leg with numbness in her fingers and toes. She also says that her dizziness is getting worse. She has some intermittent headaches, and feels nauseated. She also notes having gait problems while at work due to her balance. She denies fever, cough, UTI symptoms, or vomiting.  She was last in here for [facility] spotted fever.   She also has a knot on her right lymph node.   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    Past Medical History  Diagnosis Date  . Asthma   . Arnold-Chiari malformation   . Migraines   . Mitral valve prolapse 2012    Mild   . Hemangioma of liver - right lobe 12/17/2013     Home Meds: Prior to Admission medications   Medication Sig Start Date End Date Taking? Authorizing Provider  albuterol (PROVENTIL HFA;VENTOLIN HFA) 108 (90 BASE) MCG/ACT inhaler Inhale 2 puffs into the lungs every 6 (six) hours as needed for wheezing or shortness of breath. 08/08/14   Alycia Rossetti, MD  doxycycline (VIBRAMYCIN) 100 MG capsule Take 1 capsule (100 mg total) by mouth 2 (two) times daily. 11/14/14   Tereasa Coop, PA-C  LORazepam (ATIVAN) 0.5 MG tablet Take 1 tablet (0.5 mg total) by mouth at bedtime. 11/02/14   Gwendolyn Haber, MD  promethazine (PHENERGAN) 12.5 MG tablet Take 1 tablet (12.5 mg total)  by mouth every 12 (twelve) hours as needed for nausea or vomiting. 11/07/14   Gwendolyn Haber, MD  triamcinolone cream (KENALOG) 0.1 % Apply 1 application topically 2 (two) times daily. 11/17/14   Susy Frizzle, MD    Allergies: No Known Allergies  History   Social History  . Marital Status: Married    Spouse Name: N/A  . Number of Children: 3  . Years of Education: N/A   Occupational History  . A/P    Social History Main Topics  . Smoking status: Never Smoker   . Smokeless tobacco: Never Used  . Alcohol Use: No  . Drug Use: No  . Sexual Activity: Yes    Birth Control/ Protection: Condom   Other Topics Concern  . Not on file   Social History Narrative     Review of Systems: Constitutional: negative for chills, fever, night sweats, weight changes, or fatigue  HEENT: negative for vision changes, hearing loss, congestion, rhinorrhea, ST, epistaxis, or sinus pressure Cardiovascular: negative for chest pain or palpitations Respiratory: negative for hemoptysis, wheezing, shortness of breath, or cough Abdominal: negative for abdominal pain, vomiting, diarrhea, or constipation; positive for nausea Dermatological: negative for rash; positive for bump (right lymph node)  Neurologic: negative for syncope; positive for headache, dizziness, numbness (left fingers and toes) GI: negative for UTI symptoms All  other systems reviewed and are otherwise negative with the exception to those above and in the HPI.  Physical Exam: Blood pressure 116/72, pulse 88, temperature 98.1 F (36.7 C), temperature source Oral, resp. rate 18, height 5\' 9"  (1.753 m), weight 163 lb (73.936 kg), last menstrual period 12/16/2014, SpO2 98 %., Body mass index is 24.06 kg/(m^2). General: Well developed, well nourished, in no acute distress. Head: Normocephalic, atraumatic, eyes without discharge, sclera non-icteric, nares are without discharge. Bilateral auditory canals clear, TM's are without perforation,  pearly grey and translucent with reflective cone of light bilaterally. Oral cavity moist, posterior pharynx without exudate, erythema, peritonsillar abscess, or post nasal drip.  Neck: Supple. No thyromegaly. Full ROM. Small left posterior reticular node without erythema or fluctuance Lungs: Clear bilaterally to auscultation without wheezes, rales, or rhonchi. Breathing is unlabored. Heart: RRR with S1 S2. No murmurs, rubs, or gallops appreciated. Abdomen: Soft, non-tender, non-distended with normoactive bowel sounds. No hepatomegaly. No rebound/guarding. No obvious abdominal masses. Msk:  Strength and tone normal for age. Extremities/Skin: Warm and dry. No clubbing or cyanosis. No edema. No rashes or suspicious lesions. Neuro: Alert and oriented X 3. Moves all extremities spontaneously. Gait is normal. CNII-XII grossly in tact. Normal grasps Psych:  Responds to questions appropriately with a normal affect.   Labs: Results for orders placed or performed in visit on 01/02/15  POCT CBC  Result Value Ref Range   WBC 7.1 4.6 - 10.2 K/uL   Lymph, poc 2.0 0.6 - 3.4   POC LYMPH PERCENT 28.2 10 - 50 %L   MID (cbc) 0.5 0 - 0.9   POC MID % 7.7 0 - 12 %M   POC Granulocyte 4.6 2 - 6.9   Granulocyte percent 64.1 37 - 80 %G   RBC 4.87 4.04 - 5.48 M/uL   Hemoglobin 13.5 12.2 - 16.2 g/dL   HCT, POC 40.8 37.7 - 47.9 %   MCV 83.8 80 - 97 fL   MCH, POC 27.7 27 - 31.2 pg   MCHC 33.0 31.8 - 35.4 g/dL   RDW, POC 12.9 %   Platelet Count, POC 299 142 - 424 K/uL   MPV 7.1 0 - 99.8 fL    ASSESSMENT AND PLAN:  39 y.o. year old female with hemisensory changes, sharp intermittent headaches, and left leg pains lateral thigh This chart was scribed in my presence and reviewed by me personally.    ICD-9-CM ICD-10-CM   1. Left hand paresthesia 782.0 R20.2 POCT CBC     POCT SEDIMENTATION RATE     COMPLETE METABOLIC PANEL WITH GFR     Thyroid Panel With TSH     Vitamin B12     Vit D  25 hydroxy (rtn  osteoporosis monitoring)     B. Burgdorfi Antibodies  2. Left leg pain 729.5 M79.605 POCT CBC     POCT SEDIMENTATION RATE     COMPLETE METABOLIC PANEL WITH GFR     Thyroid Panel With TSH     Vitamin B12     Vit D  25 hydroxy (rtn osteoporosis monitoring)     B. Burgdorfi Antibodies  3. Frequent headaches 784.0 R51 POCT CBC     POCT SEDIMENTATION RATE     COMPLETE METABOLIC PANEL WITH GFR     Thyroid Panel With TSH     Vitamin B12     Vit D  25 hydroxy (rtn osteoporosis monitoring)     B. Burgdorfi Antibodies    Signed, Gwendolyn Haber, MD  01/02/2015 1:02 PM

## 2015-01-03 LAB — VITAMIN D 25 HYDROXY (VIT D DEFICIENCY, FRACTURES): Vit D, 25-Hydroxy: 27 ng/mL — ABNORMAL LOW (ref 30–100)

## 2015-01-03 LAB — THYROID PANEL WITH TSH
Free Thyroxine Index: 1.8 (ref 1.4–3.8)
T3 Uptake: 27 % (ref 22–35)
T4, Total: 6.7 ug/dL (ref 4.5–12.0)
TSH: 1.448 u[IU]/mL (ref 0.350–4.500)

## 2015-01-03 LAB — VITAMIN B12: Vitamin B-12: 484 pg/mL (ref 211–911)

## 2015-01-05 LAB — B. BURGDORFI ANTIBODIES: B burgdorferi Ab IgG+IgM: 0.63 {ISR}

## 2015-01-07 ENCOUNTER — Encounter: Payer: Self-pay | Admitting: Family Medicine

## 2015-01-07 ENCOUNTER — Ambulatory Visit (INDEPENDENT_AMBULATORY_CARE_PROVIDER_SITE_OTHER): Payer: 59 | Admitting: Family Medicine

## 2015-01-07 VITALS — BP 122/68 | HR 80 | Temp 98.2°F | Resp 16 | Ht 69.0 in | Wt 160.0 lb

## 2015-01-07 DIAGNOSIS — R42 Dizziness and giddiness: Secondary | ICD-10-CM

## 2015-01-07 MED ORDER — MECLIZINE HCL 25 MG PO TABS
25.0000 mg | ORAL_TABLET | Freq: Three times a day (TID) | ORAL | Status: DC | PRN
Start: 2015-01-07 — End: 2015-05-29

## 2015-01-07 NOTE — Patient Instructions (Signed)
Take mecliizne as prescribed  Call if not improved and MRI will be done F/U as needed

## 2015-01-07 NOTE — Progress Notes (Signed)
Patient ID: Gwendolyn Mitchell, female   DOB: 12-07-1975, 39 y.o.   MRN: [id number]   Subjective:    Patient ID: Gwendolyn Mitchell, female    DOB: 1975-12-27, 39 y.o.   MRN: [id number]  Patient presents for Vertigo  Patient here with recurrent dizzy symptoms. She's had a host of very interesting symptoms altogether. She was seen at the urgent care a little over a week ago at that time she did have a dizzy spells associated with numbness in her face and random sharp pains in her head her legs her arms. She has she had been treated for [facility] spotted fever a couple months ago they did recheck these labs as well as thyroid and metabolic CBC everything was normal with the exception of a low vitamin D. She continues to have daily symptoms of numbness and tingling on her face as well as the random pains headache and extreme dizzy spells were everything is spinning. She's not had any change in her speech. Sometimes her vision does get blurry. She has not taken any new medications and she denies any new stressors at home. She does have history of migraine disorder but these pains do not feel like her typical migraines   Review Of Systems:  GEN- denies fatigue, fever, weight loss,weakness, recent illness HEENT- denies eye drainage, change in vision, nasal discharge, CVS- denies chest pain, palpitations RESP- denies SOB, cough, wheeze ABD- denies N/V, change in stools, abd pain GU- denies dysuria, hematuria, dribbling, incontinence MSK- denies joint pain, muscle aches, injury Neuro- denies headache, +dizziness, syncope, seizure activity       Objective:    BP 122/68 mmHg  Pulse 80  Temp(Src) 98.2 F (36.8 C) (Oral)  Resp 16  Ht 5\' 9"  (1.753 m)  Wt 160 lb (72.576 kg)  BMI 23.62 kg/m2  LMP 12/16/2014 (Approximate) GEN- NAD, alert and oriented x3 HEENT- PERRL, EOMI, non injected sclera, pink conjunctiva, MMM, oropharynx clear,fundus benign  Neck- Supple, no thyromegaly CVS- RRR, no  murmur RESP-CTAB ABD-NABS,soft,NT,ND Neuro-CNII-XII , no focal deficits, normal hand to nose, normal rapid alternating movenents, neg Rhomberg EXT- No edema Pulses- Radial, DP- 2+  Orthostatics:  120/68, 118/64, 122/68       Assessment & Plan:      Problem List Items Addressed This Visit    None    Visit Diagnoses    Dizziness and giddiness    -  Primary   Unclear cause of her episodes. She does have remote history of mild  Chiara malformation as well as migraines. The random pains and numbness are little interesting. I see no signs of stroke no sign of large mass occupying lesion. No sign of infection. Possible vertigo however that does not white explain the numbness in her face in the pains that randomly come in her arms and legs. We discussed getting imaging of the brain however she wants to hold and try the meclizine first to see if this stops her symptoms     Relevant Medications    meclizine (ANTIVERT) 25 MG tablet       Note: This dictation was prepared with Dragon dictation along with smaller phrase technology. Any transcriptional errors that result from this process are unintentional.

## 2015-02-05 ENCOUNTER — Other Ambulatory Visit: Payer: Self-pay | Admitting: Obstetrics and Gynecology

## 2015-02-06 LAB — CYTOLOGY - PAP

## 2015-05-29 ENCOUNTER — Encounter: Payer: Self-pay | Admitting: Family Medicine

## 2015-05-29 ENCOUNTER — Ambulatory Visit (INDEPENDENT_AMBULATORY_CARE_PROVIDER_SITE_OTHER): Payer: 59 | Admitting: Family Medicine

## 2015-05-29 VITALS — BP 114/72 | HR 68 | Temp 98.4°F | Resp 14 | Ht 69.0 in | Wt 166.0 lb

## 2015-05-29 DIAGNOSIS — M5432 Sciatica, left side: Secondary | ICD-10-CM | POA: Diagnosis not present

## 2015-05-29 DIAGNOSIS — M79605 Pain in left leg: Secondary | ICD-10-CM | POA: Diagnosis not present

## 2015-05-29 MED ORDER — PREDNISONE 20 MG PO TABS: ORAL_TABLET | ORAL | Status: DC

## 2015-05-29 NOTE — Progress Notes (Signed)
Subjective:    Patient ID: Gwendolyn Mitchell, female    DOB: Aug 05, 1975, 39 y.o.   MRN: [id number]  HPI  patient reports several months of pain  In her left leg. She reports a constant deep ache in the anterior lateral left thigh. She denies any falls or injuries. She also reports burning and stinging pain radiating down her leg from her lateral hip into her lower leg all the way down to her feet with occasional pins and needles in her feet. She is tender to palpation over the greater trochanter in the left hip. She also has a positive straight leg raise on the left. Muscle strength is 5 over 5 equal and symmetric in both legs. She has normal reflexes in both legs. She has normal sensation in both legs. There is no evidence of cauda equina syndrome. Past Medical History  Diagnosis Date  . Asthma   . Arnold-Chiari malformation ([place])   . Migraines   . Mitral valve prolapse 2012    Mild   . Hemangioma of liver - right lobe 12/17/2013   No past surgical history on file. Current Outpatient Prescriptions on File Prior to Visit  Medication Sig Dispense Refill  . albuterol (PROVENTIL HFA;VENTOLIN HFA) 108 (90 BASE) MCG/ACT inhaler Inhale 2 puffs into the lungs every 6 (six) hours as needed for wheezing or shortness of breath. 1 Inhaler 3   No current facility-administered medications on file prior to visit.   No Known Allergies Social History   Social History  . Marital Status: Married    Spouse Name: N/A  . Number of Children: 3  . Years of Education: N/A   Occupational History  . A/P    Social History Main Topics  . Smoking status: Never Smoker   . Smokeless tobacco: Never Used  . Alcohol Use: No  . Drug Use: No  . Sexual Activity: Yes    Birth Control/ Protection: Condom   Other Topics Concern  . Not on file   Social History Narrative      Review of Systems  All other systems reviewed and are negative.      Objective:   Physical Exam  Cardiovascular: Normal rate,  regular rhythm and normal heart sounds.   No murmur heard. Pulmonary/Chest: Effort normal and breath sounds normal.  Musculoskeletal:       Left hip: She exhibits tenderness and bony tenderness. She exhibits normal range of motion and normal strength.       Lumbar back: She exhibits normal range of motion, no tenderness, no swelling, no pain and no spasm.  Neurological: She has normal reflexes. She displays normal reflexes. She exhibits normal muscle tone. Coordination normal.  Vitals reviewed.         Assessment & Plan:  Sciatica of left side - Plan: predniSONE (DELTASONE) 20 MG tablet, CANCELED: DG Lumbar Spine Complete  Sciatica, left - Plan: DG FEMUR MIN 2 VIEWS LEFT, DG Lumbar Spine Complete  Left leg pain - Plan: DG FEMUR MIN 2 VIEWS LEFT   Patient appears to have sciatica in the left leg. She also has greater trochanteric bursitis in the left leg. I will obtain x-rays of the left femur to rule out any abnormalities in the distal left femur that could cause this constant aching pain however I believe it is referred pain from her sciatica. I will obtain x-rays of the lumbar spine to evaluate for degenerative disc disease in the lower back. Meanwhile treat the patient sciatica and  bursitis in her left hip with a prednisone taper pack. Recheck in one week and also depending upon the results of the x-rays

## 2015-06-01 ENCOUNTER — Ambulatory Visit
Admission: RE | Admit: 2015-06-01 | Discharge: 2015-06-01 | Disposition: A | Discharge status: Home / Self Care | Payer: 59 | Source: Ambulatory Visit | Attending: Family Medicine | Admitting: Family Medicine

## 2015-06-01 DIAGNOSIS — M79605 Pain in left leg: Secondary | ICD-10-CM

## 2015-06-01 DIAGNOSIS — M5432 Sciatica, left side: Secondary | ICD-10-CM

## 2015-06-02 ENCOUNTER — Encounter: Payer: Self-pay | Admitting: Family Medicine

## 2015-06-19 ENCOUNTER — Other Ambulatory Visit: Payer: Self-pay

## 2015-06-19 DIAGNOSIS — N6459 Other signs and symptoms in breast: Secondary | ICD-10-CM

## 2016-02-19 IMAGING — US US ABDOMEN COMPLETE
1 series · 14 of 25 positions shown · non-contrast
Comparison: Portions of the pancreas are obscured by bowel gas.

CLINICAL DATA: Probable 1.5 cm liver hemangioma in the right lobe.

EXAM:
ULTRASOUND ABDOMEN COMPLETE

[Series 1: us abdomen complete · 0.33mm/px · 14 of 88 slices shown]
[im 1/88]
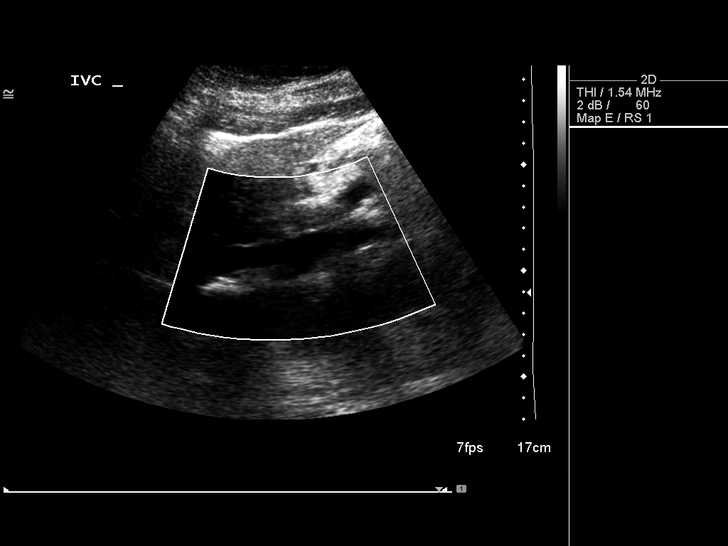
[im 8/88]
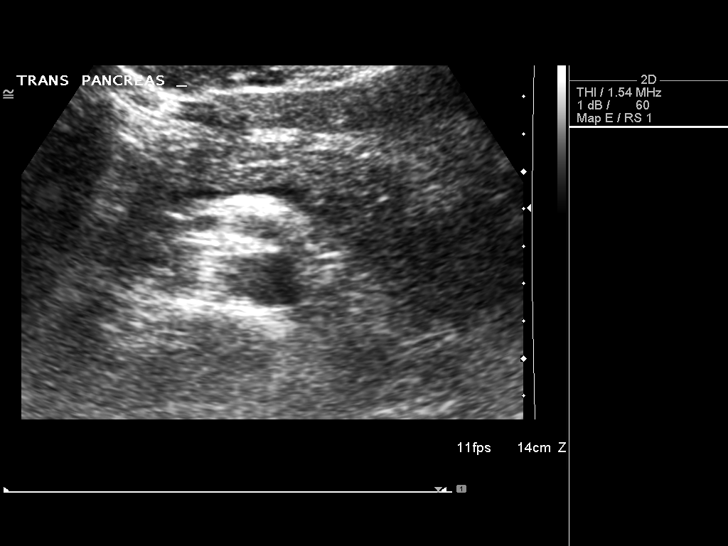
[im 15/88]
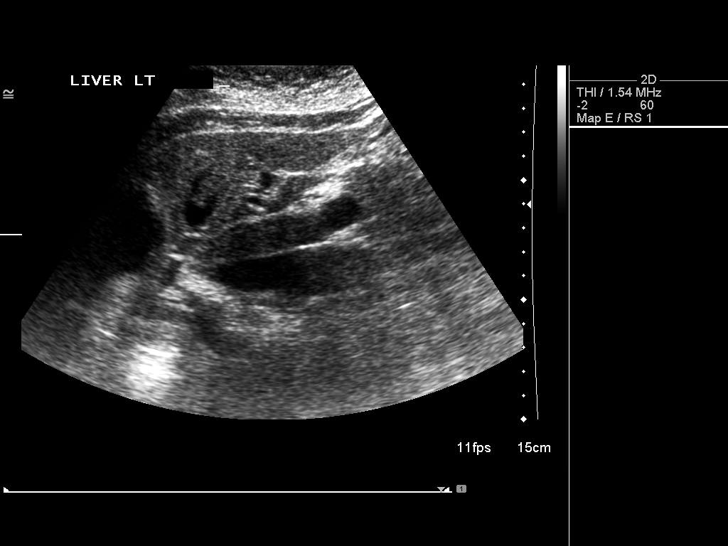
[im 22/88]
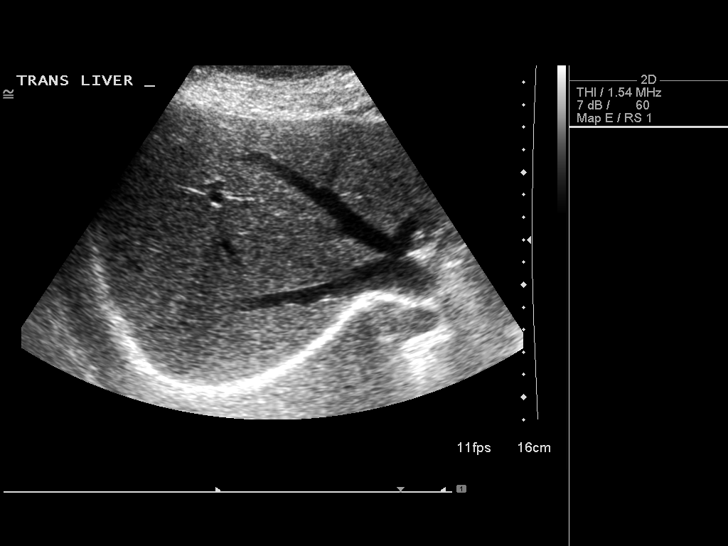
[im 30/88]
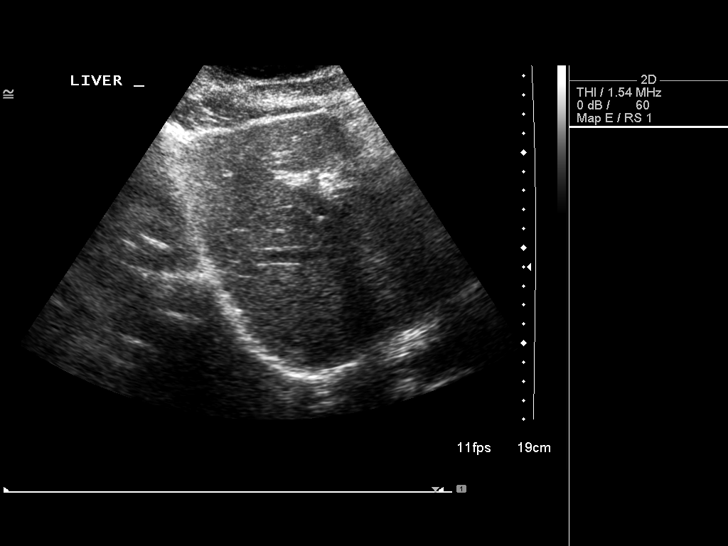
[im 33/88]
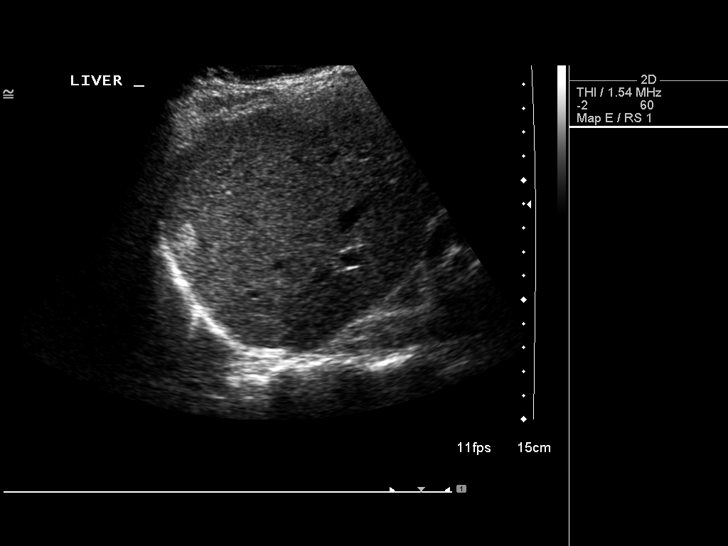
[im 40/88]
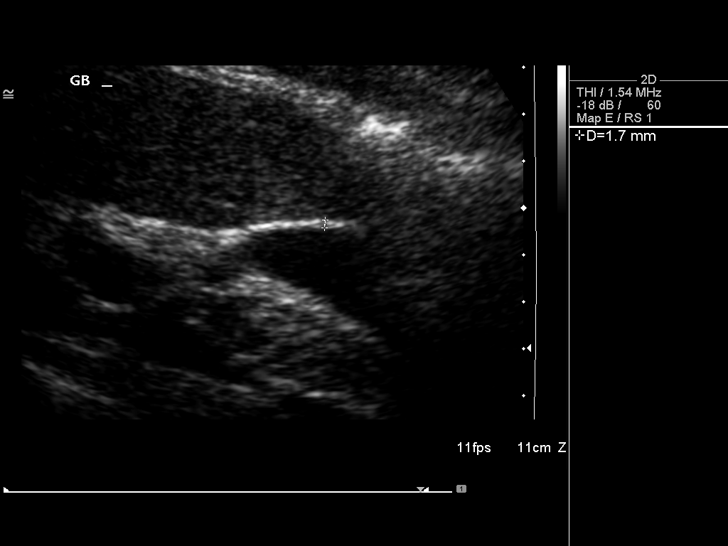
[im 48/88]
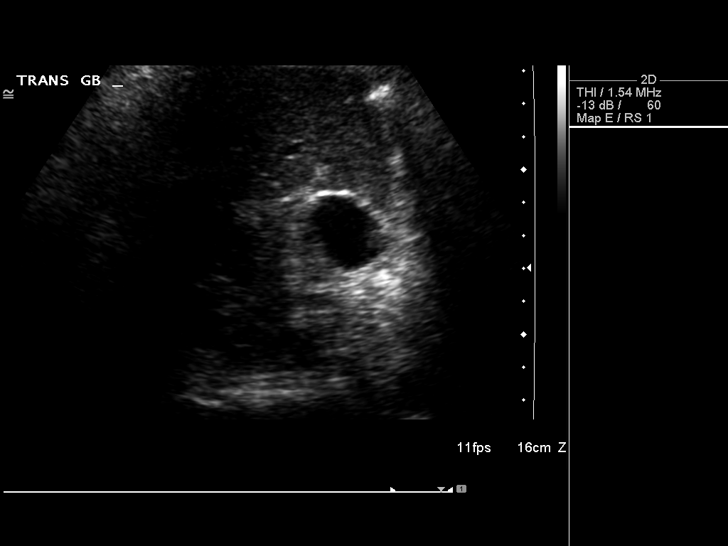
[im 55/88]
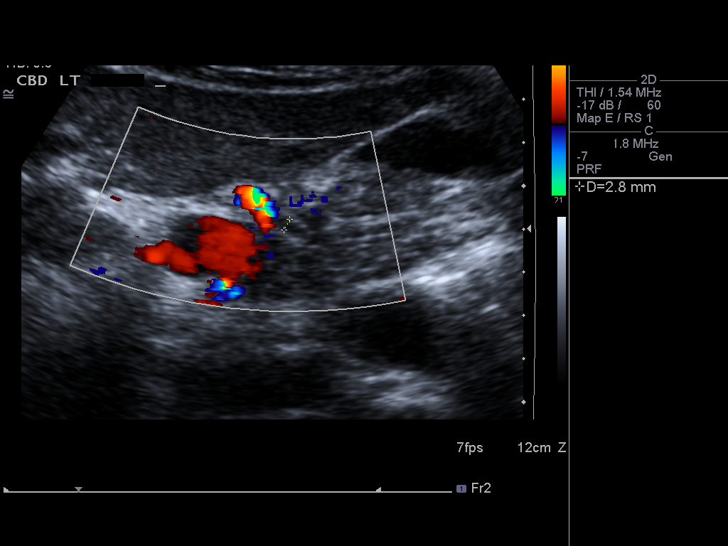
[im 59/88]
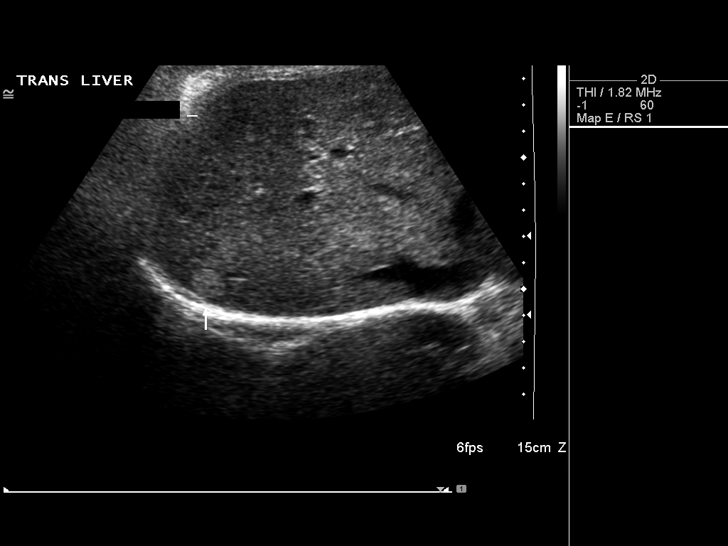
[im 66/88]
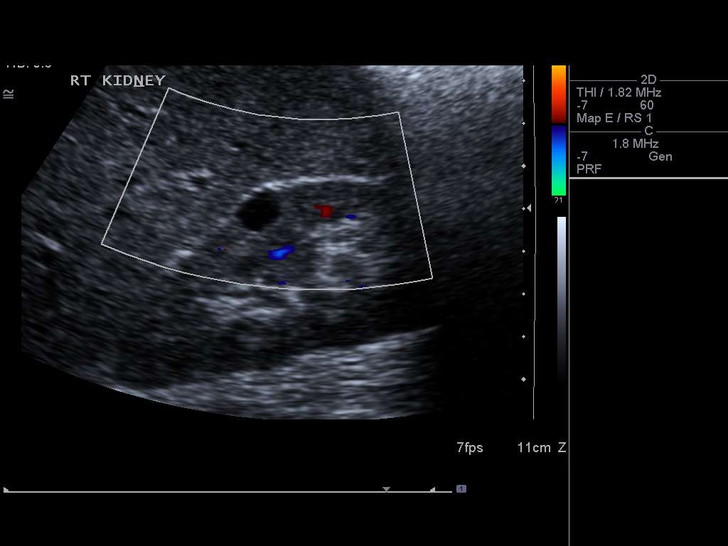
[im 73/88]
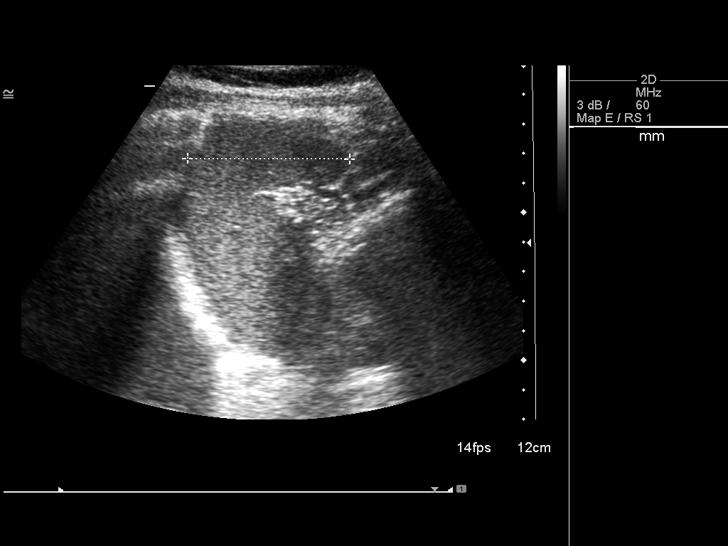
[im 80/88]
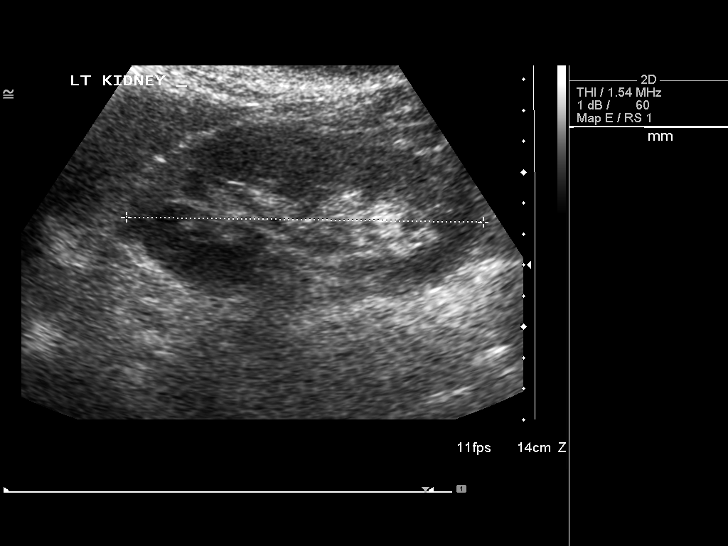
[im 88/88]
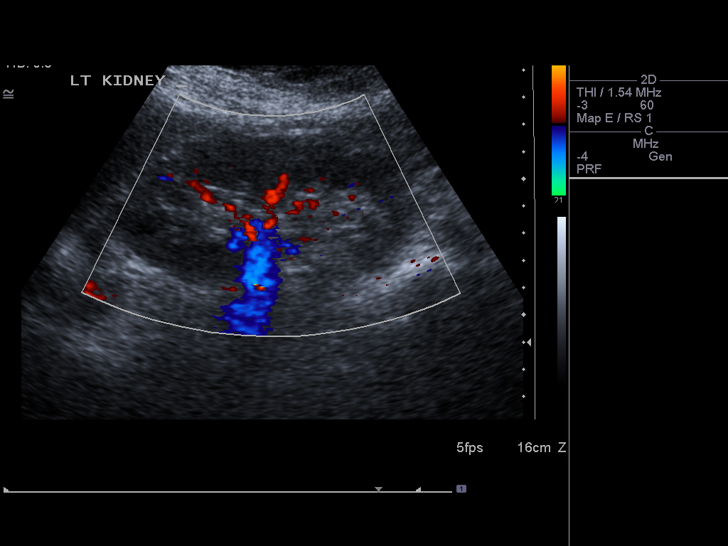

[14 of 25 positions shown; findings below may reference images not displayed]

FINDINGS: Gallbladder:

The gallbladder is visualized and no gallstones are noted. There is
no pain over the gallbladder with compression.

Common bile duct:

Diameter: The common bile duct is normal measuring 2.8 mm in
diameter.

Liver:

The liver has a normal echogenic pattern. However within the
superior right lobe posteriorly and laterally there is in echogenic
focus consistent with incidental hemangioma of 1.2 x 1.5 x 1.3 cm.

IVC:

No abnormality visualized.

Pancreas:

The head and tail of the pancreas are obscured by bowel gas.

Spleen:

The spleen is normal measuring 5.5 cm sagittally.

Right Kidney:

Length: 11.4 cm.. A small hypoechoic structure most consistent with
cyst is noted in the mid anterior right kidney of 1.0 cm in maximum
diameter.

Left Kidney:

Length: 11.6 cm..  No hydronephrosis is seen.

Abdominal aorta:

The abdominal aorta is normal in caliber

Other findings:

None.
IMPRESSION: 1. No gallstones.  No ductal dilatation.

## 2016-03-14 ENCOUNTER — Encounter: Payer: Self-pay | Admitting: Family Medicine

## 2016-03-14 ENCOUNTER — Ambulatory Visit (INDEPENDENT_AMBULATORY_CARE_PROVIDER_SITE_OTHER): Payer: 59 | Admitting: Family Medicine

## 2016-03-14 VITALS — BP 110/64 | HR 68 | Temp 98.9°F | Resp 14 | Ht 69.0 in | Wt 165.0 lb

## 2016-03-14 DIAGNOSIS — H60331 Swimmer's ear, right ear: Secondary | ICD-10-CM | POA: Diagnosis not present

## 2016-03-14 DIAGNOSIS — R21 Rash and other nonspecific skin eruption: Secondary | ICD-10-CM

## 2016-03-14 LAB — BASIC METABOLIC PANEL
BUN: 11 mg/dL (ref 7–25)
CALCIUM: 9.4 mg/dL (ref 8.6–10.2)
CO2: 24 mmol/L (ref 20–31)
CREATININE: 0.72 mg/dL (ref 0.50–1.10)
Chloride: 103 mmol/L (ref 98–110)
GLUCOSE: 90 mg/dL (ref 70–99)
Potassium: 4.7 mmol/L (ref 3.5–5.3)
Sodium: 136 mmol/L (ref 135–146)

## 2016-03-14 LAB — CBC WITH DIFFERENTIAL/PLATELET
BASOS ABS: 73 {cells}/uL (ref 0–200)
Basophils Relative: 1 %
Eosinophils Absolute: 292 cells/uL (ref 15–500)
Eosinophils Relative: 4 %
HCT: 41.5 % (ref 35.0–45.0)
HEMOGLOBIN: 14 g/dL (ref 12.0–15.0)
Lymphocytes Relative: 22 %
Lymphs Abs: 1606 cells/uL (ref 850–3900)
MCH: 28.1 pg (ref 27.0–33.0)
MCHC: 33.7 g/dL (ref 32.0–36.0)
MCV: 83.3 fL (ref 80.0–100.0)
MONOS PCT: 9 %
MPV: 10 fL (ref 7.5–12.5)
Monocytes Absolute: 657 cells/uL (ref 200–950)
Neutro Abs: 4672 cells/uL (ref 1500–7800)
Neutrophils Relative %: 64 %
Platelets: 317 10*3/uL (ref 140–400)
RBC: 4.98 MIL/uL (ref 3.80–5.10)
RDW: 12.8 % (ref 11.0–15.0)
WBC: 7.3 10*3/uL (ref 3.8–10.8)

## 2016-03-14 MED ORDER — PREDNISONE 20 MG PO TABS
ORAL_TABLET | ORAL | 0 refills | Status: DC
Start: 2016-03-14 — End: 2022-12-08

## 2016-03-14 MED ORDER — CIPROFLOXACIN-DEXAMETHASONE 0.3-0.1 % OT SUSP: 4.0000 [drp] | Freq: Two times a day (BID) | OTIC | 0 refills | Status: AC

## 2016-03-14 MED ORDER — HYDROXYZINE HCL 10 MG PO TABS
10.0000 mg | ORAL_TABLET | Freq: Three times a day (TID) | ORAL | 0 refills | Status: AC | PRN
Start: 2016-03-14 — End: ?

## 2016-03-14 NOTE — Progress Notes (Signed)
   Subjective:    Patient ID: Gwendolyn Mitchell, female    DOB: Mar 03, 1976, 40 y.o.   MRN: [id number]  Patient presents for Generalized Itching (x2 weeks- states that she hsa increased itching on torso and legs- reports that she now has itching on face- has been using calamine lotion ) and B Ear Drainage (x1 week- clear fluid draining for B ears- alos repors inner ear itching) Patient here with generalized itching. Started on her feet and then would move around to different places she never noticed any particular rash until this week now she has itching on her neck and her face and they have broken out. She is did start a new multivitamin 3 weeks ago she stopped that however continued to have the rash and itching. Over the past week she's also had some drainage from bilateral ears the right is worse than the left and she had some night sweats last night.Subjective fever. She's not had any cough or congestion symptoms no sick contacts. She does visit the nursing home but no one else at home has a rash. She's been taking Benadryl which helps some.  No change in soap, detergent     Review Of Systems: per above  GEN- denies fatigue, fever, weight loss,weakness, recent illness HEENT- denies eye drainage, change in vision, nasal discharge, CVS- denies chest pain, palpitations RESP- denies SOB, cough, wheeze ABD- denies N/V, change in stools, abd pain GU- denies dysuria, hematuria, dribbling, incontinence MSK- denies joint pain, muscle aches, injury Neuro- denies headache, dizziness, syncope, seizure activity       Objective:    BP 110/64 (BP Location: Left Arm, Patient Position: Sitting, Cuff Size: Normal)   Pulse 68   Temp 98.9 F (37.2 C) (Oral)   Resp 14   Ht 5\' 9"  (1.753 m)   Wt 165 lb (74.8 kg)   BMI 24.37 kg/m  GEN- NAD, alert and oriented x3 HEENT- PERRL, EOMI, non injected sclera, pink conjunctiva, MMM, oropharynx clear, RIght canal swelling, erythema, mild drainage, TM in tact clear,  left Canal mild erythema, small pustule on pinna TM clear bilat  Neck- Supple, right post auricular LAD  CVS- RRR, no murmur RESP-CTAB Skin- erythematous urticarial like quarter size on left neck, erythema small maculopapular lesions on forehead, between browns, few lesions on lower back, scabs on bilat feet, no lesions in web spaces, on lesions on abdomen, upper back, arms EXT- No edema Pulses- Radial, 2+        Assessment & Plan:      Problem List Items Addressed This Visit    None    Visit Diagnoses    Acute swimmer's ear of right side    -  Primary   mild infection left ear starting at pinna, treat with ciprodex, also with some type of allergic like rash but facial involvment and pruritis, treat with prednisone at hydroxyzine at bedtime Pt requested labs suspect WBC will be a little elevated    Relevant Orders   CBC with Differential/Platelet   Rash and nonspecific skin eruption       Relevant Orders   CBC with Differential/Platelet   Basic metabolic panel      Note: This dictation was prepared with Dragon dictation along with smaller phrase technology. Any transcriptional errors that result from this process are unintentional.

## 2016-03-14 NOTE — Patient Instructions (Signed)
Take atarax at bedtime Start prednisone  Ear drops  F/U as needed

## 2016-08-11 ENCOUNTER — Other Ambulatory Visit: Payer: Self-pay | Admitting: *Deleted

## 2016-08-11 DIAGNOSIS — E785 Hyperlipidemia, unspecified: Secondary | ICD-10-CM

## 2016-08-26 IMAGING — US US ABDOMEN LIMITED
1 series · 14 of 25 positions shown · non-contrast
Comparison: 10/04/2013

CLINICAL DATA: Follow-up right liver lesion

EXAM:
US ABDOMEN LIMITED - RIGHT UPPER QUADRANT

[Series 1: us abdomen limited · 0.22mm/px · 14 of 31 slices shown]
[im 1/31]
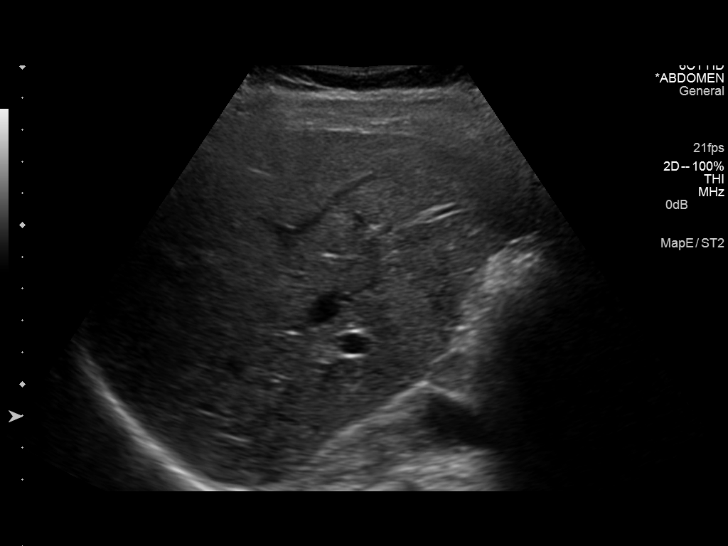
[im 3/31]
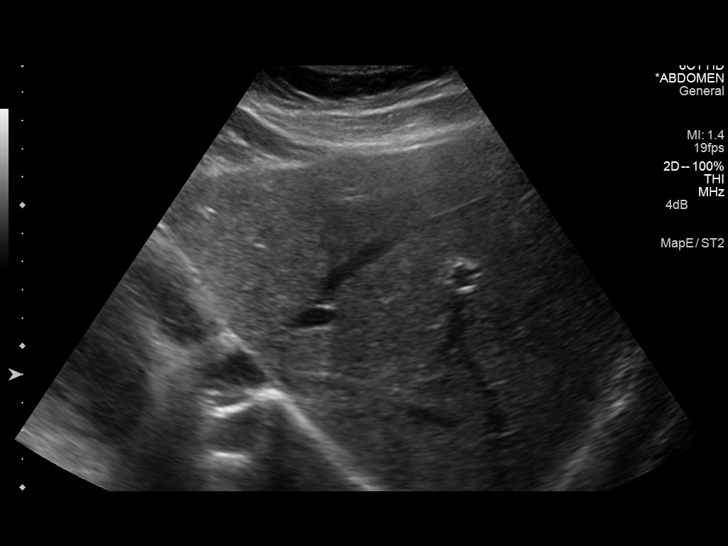
[im 6/31]
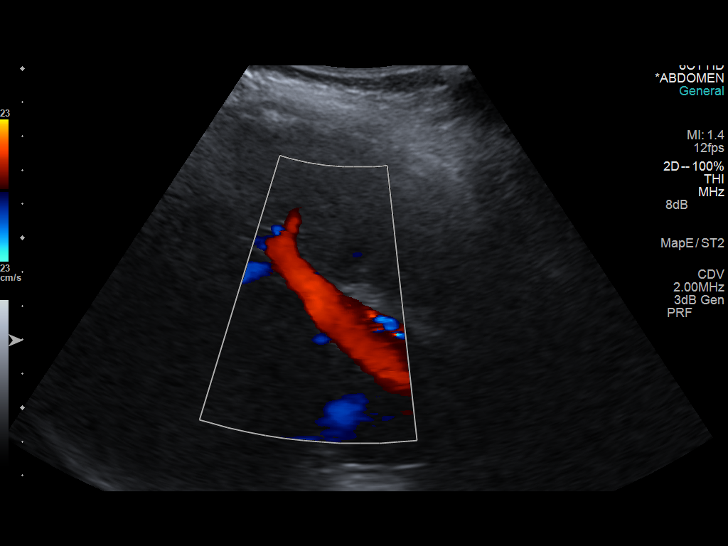
[im 8/31]
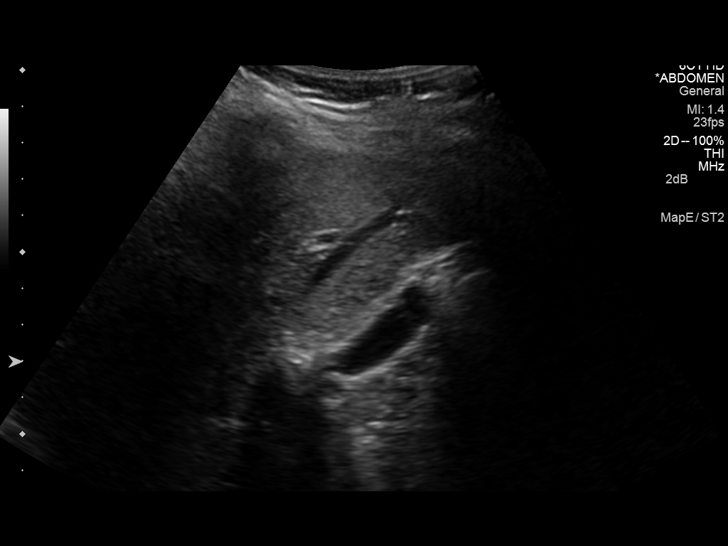
[im 11/31]
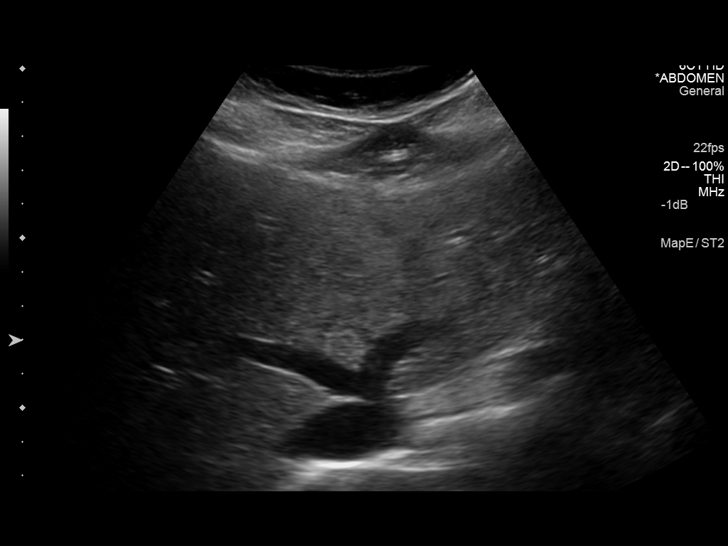
[im 12/31]
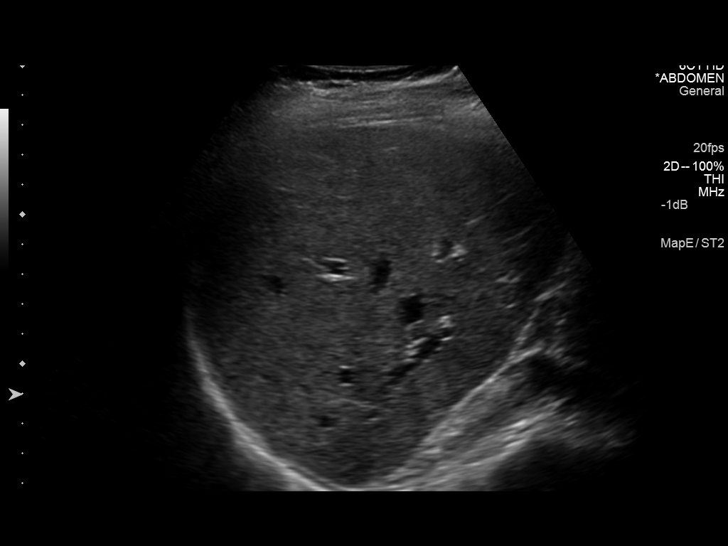
[im 14/31]
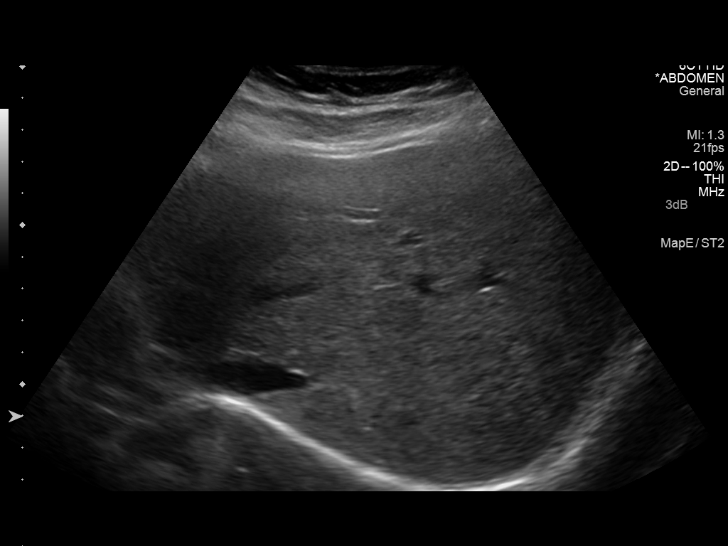
[im 17/31]
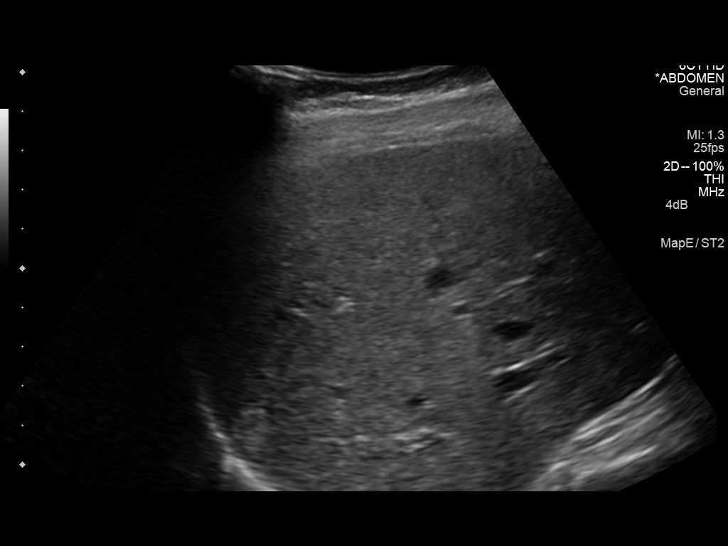
[im 19/31]
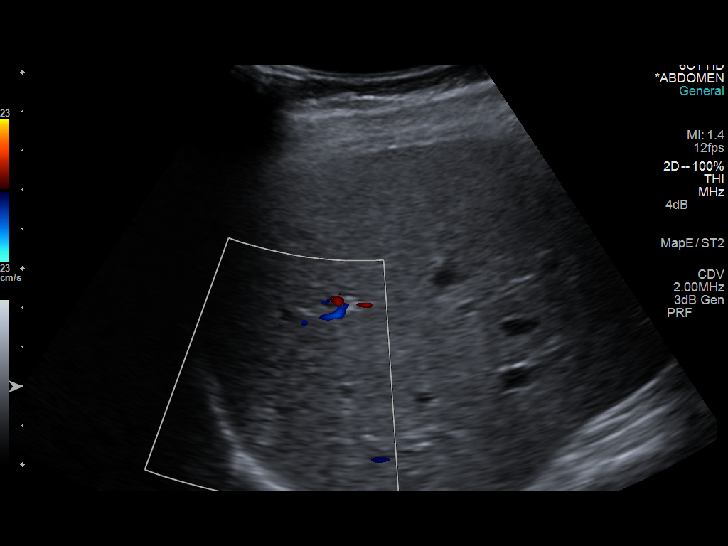
[im 21/31]
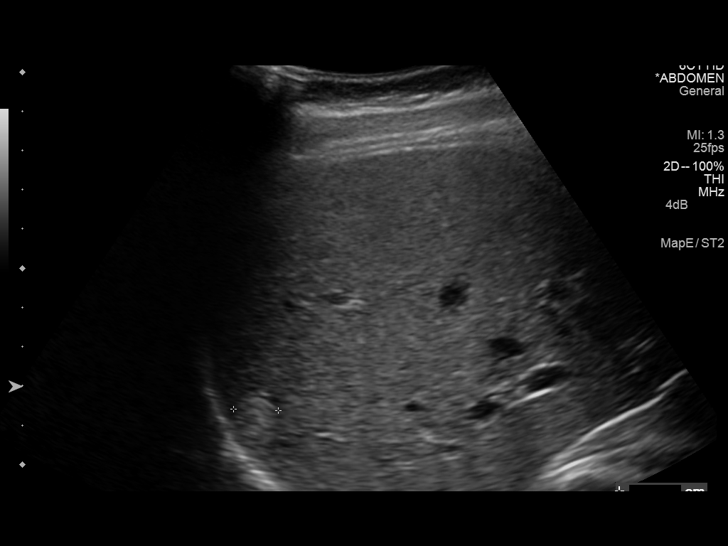
[im 23/31]
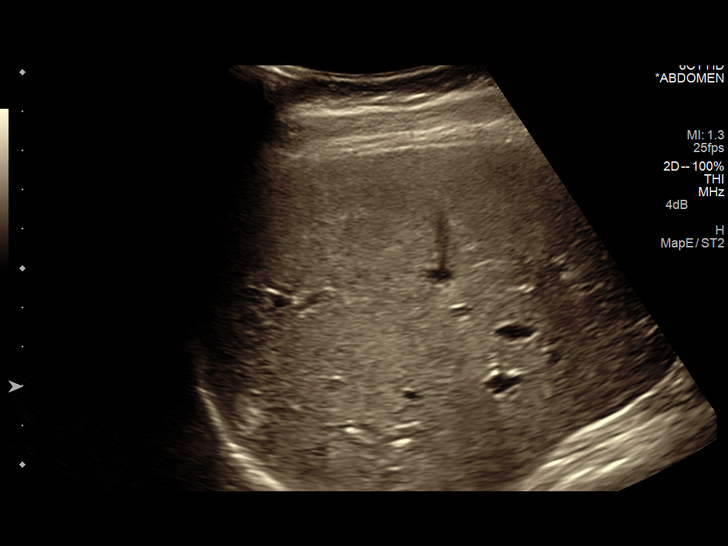
[im 26/31]
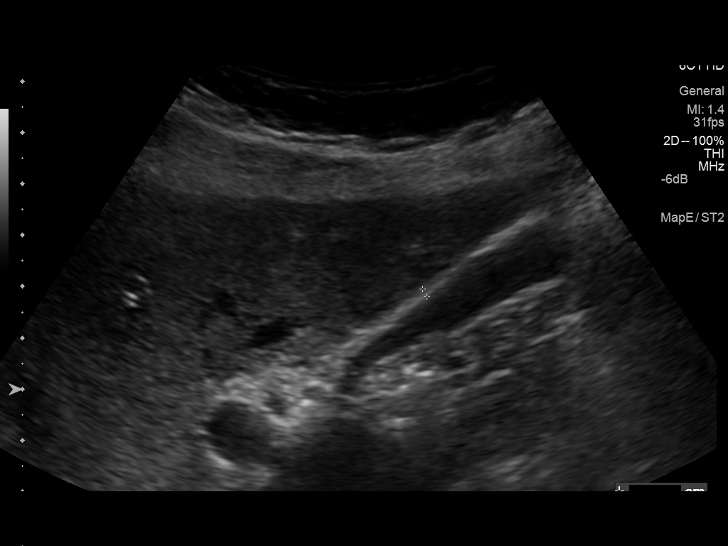
[im 28/31]
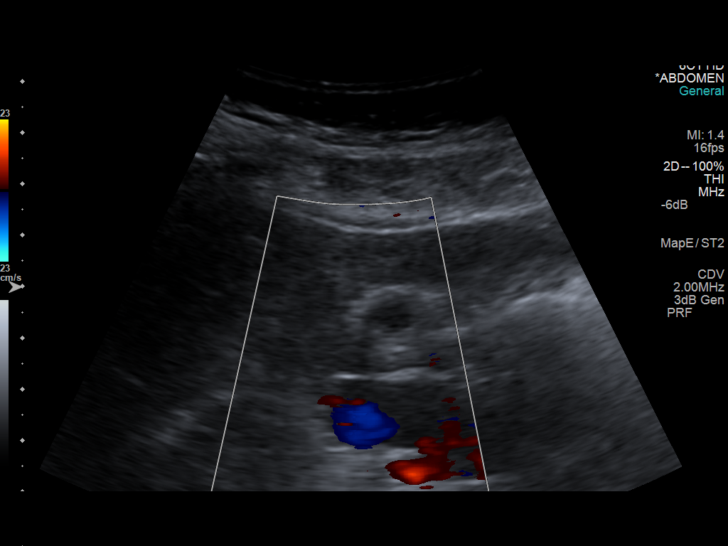
[im 31/31]
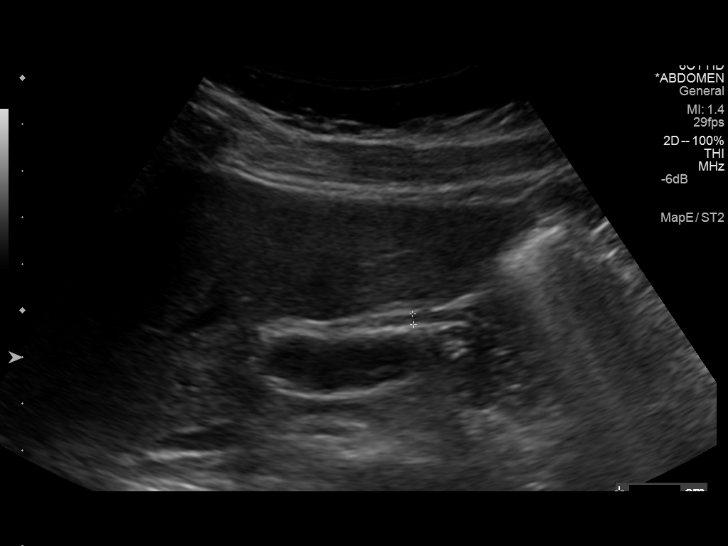

[14 of 25 positions shown; findings below may reference images not displayed]

FINDINGS: Gallbladder:

No gallstones, gallbladder wall thickening, or pericholecystic
fluid. Negative sonographic Murphy's sign.

Common bile duct:

Diameter: 2 mm

Liver:

12 x 12 x 12 mm hyperechoic lesion in the right hepatic lobe,
favored to reflect a benign hemangioma, grossly unchanged.
IMPRESSION: 12 mm probable hemangioma in the right hepatic lobe, unchanged.

## 2016-11-08 ENCOUNTER — Other Ambulatory Visit: Payer: Self-pay

## 2018-02-26 ENCOUNTER — Encounter: Payer: Self-pay | Admitting: Family Medicine

## 2019-11-04 ENCOUNTER — Other Ambulatory Visit: Payer: Self-pay | Admitting: Obstetrics and Gynecology

## 2019-11-04 DIAGNOSIS — R928 Other abnormal and inconclusive findings on diagnostic imaging of breast: Secondary | ICD-10-CM

## 2019-11-13 ENCOUNTER — Other Ambulatory Visit: Payer: Self-pay | Admitting: Obstetrics and Gynecology

## 2019-11-13 ENCOUNTER — Ambulatory Visit
Admission: RE | Admit: 2019-11-13 | Discharge: 2019-11-13 | Disposition: A | Discharge status: Home / Self Care | Payer: 59 | Source: Ambulatory Visit | Attending: Obstetrics and Gynecology | Admitting: Obstetrics and Gynecology

## 2019-11-13 ENCOUNTER — Other Ambulatory Visit: Payer: Self-pay

## 2019-11-13 DIAGNOSIS — R928 Other abnormal and inconclusive findings on diagnostic imaging of breast: Secondary | ICD-10-CM

## 2019-11-13 DIAGNOSIS — N6310 Unspecified lump in the right breast, unspecified quadrant: Secondary | ICD-10-CM

## 2020-05-15 ENCOUNTER — Ambulatory Visit
Admission: RE | Admit: 2020-05-15 | Discharge: 2020-05-15 | Disposition: A | Discharge status: Home / Self Care | Payer: 59 | Source: Ambulatory Visit | Attending: Obstetrics and Gynecology | Admitting: Obstetrics and Gynecology

## 2020-05-15 ENCOUNTER — Other Ambulatory Visit: Payer: Self-pay | Admitting: Obstetrics and Gynecology

## 2020-05-15 ENCOUNTER — Other Ambulatory Visit: Payer: Self-pay

## 2020-05-15 ENCOUNTER — Ambulatory Visit
Admission: RE | Admit: 2020-05-15 | Discharge: 2020-05-15 | Disposition: A | Discharge status: Home / Self Care | Payer: Self-pay | Source: Ambulatory Visit | Attending: Obstetrics and Gynecology | Admitting: Obstetrics and Gynecology

## 2020-05-15 DIAGNOSIS — N6310 Unspecified lump in the right breast, unspecified quadrant: Secondary | ICD-10-CM

## 2020-05-28 ENCOUNTER — Other Ambulatory Visit: Payer: Self-pay

## 2020-05-28 ENCOUNTER — Ambulatory Visit
Admission: RE | Admit: 2020-05-28 | Discharge: 2020-05-28 | Disposition: A | Discharge status: Home / Self Care | Payer: 59 | Source: Ambulatory Visit | Attending: Obstetrics and Gynecology | Admitting: Obstetrics and Gynecology

## 2020-05-28 DIAGNOSIS — N6310 Unspecified lump in the right breast, unspecified quadrant: Secondary | ICD-10-CM

## 2020-05-28 HISTORY — PX: BREAST BIOPSY: SHX20

## 2020-10-22 ENCOUNTER — Other Ambulatory Visit: Payer: Self-pay | Admitting: Obstetrics and Gynecology

## 2020-10-22 DIAGNOSIS — N6001 Solitary cyst of right breast: Secondary | ICD-10-CM

## 2020-12-07 ENCOUNTER — Other Ambulatory Visit: Payer: Self-pay | Admitting: Obstetrics and Gynecology

## 2020-12-07 ENCOUNTER — Ambulatory Visit
Admission: RE | Admit: 2020-12-07 | Discharge: 2020-12-07 | Disposition: A | Discharge status: Home / Self Care | Payer: 59 | Source: Ambulatory Visit | Attending: Obstetrics and Gynecology | Admitting: Obstetrics and Gynecology

## 2020-12-07 ENCOUNTER — Other Ambulatory Visit: Payer: Self-pay

## 2020-12-07 DIAGNOSIS — R928 Other abnormal and inconclusive findings on diagnostic imaging of breast: Secondary | ICD-10-CM

## 2020-12-07 DIAGNOSIS — N6001 Solitary cyst of right breast: Secondary | ICD-10-CM

## 2020-12-07 DIAGNOSIS — N6320 Unspecified lump in the left breast, unspecified quadrant: Secondary | ICD-10-CM

## 2021-03-10 ENCOUNTER — Ambulatory Visit
Admission: RE | Admit: 2021-03-10 | Discharge: 2021-03-10 | Disposition: A | Discharge status: Home / Self Care | Payer: 59 | Source: Ambulatory Visit | Attending: Obstetrics and Gynecology | Admitting: Obstetrics and Gynecology

## 2021-03-10 ENCOUNTER — Other Ambulatory Visit: Payer: Self-pay

## 2021-03-10 ENCOUNTER — Other Ambulatory Visit: Payer: Self-pay | Admitting: Obstetrics and Gynecology

## 2021-03-10 DIAGNOSIS — N6320 Unspecified lump in the left breast, unspecified quadrant: Secondary | ICD-10-CM

## 2021-06-10 ENCOUNTER — Ambulatory Visit
Admission: RE | Admit: 2021-06-10 | Discharge: 2021-06-10 | Disposition: A | Discharge status: Home / Self Care | Payer: 59 | Source: Ambulatory Visit | Attending: Obstetrics and Gynecology | Admitting: Obstetrics and Gynecology

## 2021-06-10 ENCOUNTER — Other Ambulatory Visit: Payer: Self-pay | Admitting: Obstetrics and Gynecology

## 2021-06-10 DIAGNOSIS — N6320 Unspecified lump in the left breast, unspecified quadrant: Secondary | ICD-10-CM

## 2021-12-10 ENCOUNTER — Ambulatory Visit
Admission: RE | Admit: 2021-12-10 | Discharge: 2021-12-10 | Disposition: A | Discharge status: Home / Self Care | Payer: Self-pay | Source: Ambulatory Visit | Attending: Obstetrics and Gynecology | Admitting: Obstetrics and Gynecology

## 2021-12-10 DIAGNOSIS — N6320 Unspecified lump in the left breast, unspecified quadrant: Secondary | ICD-10-CM

## 2022-12-08 ENCOUNTER — Ambulatory Visit
Admission: EM | Admit: 2022-12-08 | Discharge: 2022-12-08 | Disposition: A | Discharge status: Home / Self Care | Payer: Commercial Managed Care - HMO

## 2022-12-08 ENCOUNTER — Ambulatory Visit (INDEPENDENT_AMBULATORY_CARE_PROVIDER_SITE_OTHER): Payer: Commercial Managed Care - HMO

## 2022-12-08 DIAGNOSIS — S161XXA Strain of muscle, fascia and tendon at neck level, initial encounter: Secondary | ICD-10-CM | POA: Diagnosis not present

## 2022-12-08 DIAGNOSIS — M542 Cervicalgia: Secondary | ICD-10-CM | POA: Diagnosis not present

## 2022-12-08 MED ORDER — PREDNISONE 50 MG PO TABS
50.0000 mg | ORAL_TABLET | Freq: Every day | ORAL | 0 refills | Status: AC
Start: 2022-12-08 — End: ?

## 2022-12-08 MED ORDER — CYCLOBENZAPRINE HCL 5 MG PO TABS: 5.0000 mg | ORAL_TABLET | Freq: Three times a day (TID) | ORAL | 0 refills | Status: AC | PRN
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
## 2023-06-20 ENCOUNTER — Other Ambulatory Visit: Payer: Self-pay | Admitting: Pain Medicine

## 2023-06-20 DIAGNOSIS — N6320 Unspecified lump in the left breast, unspecified quadrant: Secondary | ICD-10-CM

## 2023-06-29 ENCOUNTER — Ambulatory Visit
Admission: RE | Admit: 2023-06-29 | Discharge: 2023-06-29 | Disposition: A | Discharge status: Home / Self Care | Payer: Commercial Managed Care - HMO | Source: Ambulatory Visit | Attending: Pain Medicine | Admitting: Pain Medicine

## 2023-06-29 ENCOUNTER — Ambulatory Visit
Admission: RE | Admit: 2023-06-29 | Discharge: 2023-06-29 | Disposition: A | Discharge status: Home / Self Care | Payer: Medicaid Other | Source: Ambulatory Visit | Attending: Pain Medicine | Admitting: Pain Medicine

## 2023-06-29 DIAGNOSIS — N6320 Unspecified lump in the left breast, unspecified quadrant: Secondary | ICD-10-CM

## 2023-09-05 ENCOUNTER — Encounter: Payer: Self-pay | Admitting: Cardiology

## 2023-09-05 ENCOUNTER — Ambulatory Visit: Attending: Cardiology | Admitting: Cardiology

## 2023-09-05 VITALS — BP 142/78 | HR 93 | Ht 70.0 in | Wt 169.8 lb

## 2023-09-05 DIAGNOSIS — I517 Cardiomegaly: Secondary | ICD-10-CM | POA: Diagnosis not present

## 2023-09-05 DIAGNOSIS — Z7689 Persons encountering health services in other specified circumstances: Secondary | ICD-10-CM

## 2023-09-05 DIAGNOSIS — I2510 Atherosclerotic heart disease of native coronary artery without angina pectoris: Secondary | ICD-10-CM | POA: Diagnosis not present

## 2023-09-05 NOTE — Patient Instructions (Addendum)
 Medication Instructions:  Your physician recommends that you continue on your current medications as directed. Please refer to the Current Medication list given to you today.  *If you need a refill on your cardiac medications before your next appointment, please call your pharmacy*  Testing/Procedures: Your physician has requested that you have an echocardiogram. Echocardiography is a painless test that uses sound waves to create images of your heart. It provides your doctor with information about the size and shape of your heart and how well your heart's chambers and valves are working. This procedure takes approximately one hour. There are no restrictions for this procedure. Please do NOT wear cologne, perfume, aftershave, or lotions (deodorant is allowed). Please arrive 15 minutes prior to your appointment time.  Please note: We ask at that you not bring children with you during ultrasound (echo/ vascular) testing. Due to room size and safety concerns, children are not allowed in the ultrasound rooms during exams. Our front office staff cannot provide observation of children in our lobby area while testing is being conducted. An adult accompanying a patient to their appointment will only be allowed in the ultrasound room at the discretion of the ultrasound technician under special circumstances. We apologize for any inconvenience.  Dr. Servando Salina has ordered a CT coronary calcium score.   Test locations:  [facility]  [facility]  This is $99 out of pocket.   Coronary CalciumScan A coronary calcium scan is an imaging test used to look for deposits of calcium and other fatty materials (plaques) in the inner lining of the blood vessels of the heart (coronary arteries). These deposits of calcium and plaques can partly clog and narrow the coronary arteries without producing any symptoms or warning signs. This  puts a person at risk for a heart attack. This test can detect these deposits before symptoms develop. Tell a health care provider about: Any allergies you have. All medicines you are taking, including vitamins, herbs, eye drops, creams, and over-the-counter medicines. Any problems you or family members have had with anesthetic medicines. Any blood disorders you have. Any surgeries you have had. Any medical conditions you have. Whether you are pregnant or may be pregnant. What are the risks? Generally, this is a safe procedure. However, problems may occur, including: Harm to a pregnant woman and her unborn baby. This test involves the use of radiation. Radiation exposure can be dangerous to a pregnant woman and her unborn baby. If you are pregnant, you generally should not have this procedure done. Slight increase in the risk of cancer. This is because of the radiation involved in the test. What happens before the procedure? No preparation is needed for this procedure. What happens during the procedure? You will undress and remove any jewelry around your neck or chest. You will put on a hospital gown. Sticky electrodes will be placed on your chest. The electrodes will be connected to an electrocardiogram (ECG) machine to record a tracing of the electrical activity of your heart. A CT scanner will take pictures of your heart. During this time, you will be asked to lie still and hold your breath for 2-3 seconds while a picture of your heart is being taken. The procedure may vary among health care providers and hospitals. What happens after the procedure? You can get dressed. You can return to your normal activities. It is up to you to get the results of your test. Ask your  health care provider, or the department that is doing the test, when your results will be ready. Summary A coronary calcium scan is an imaging test used to look for deposits of calcium and other fatty materials (plaques) in  the inner lining of the blood vessels of the heart (coronary arteries). Generally, this is a safe procedure. Tell your health care provider if you are pregnant or may be pregnant. No preparation is needed for this procedure. A CT scanner will take pictures of your heart. You can return to your normal activities after the scan is done. This information is not intended to replace advice given to you by your health care provider. Make sure you discuss any questions you have with your health care provider. Document Released: 11/26/2007 Document Revised: 04/18/2016 Document Reviewed: 04/18/2016 Elsevier Interactive Patient Education  2017 [employer].   Follow-Up: At [facility], you and your health needs are our priority.  As part of our continuing mission to provide you with exceptional heart care, we have created designated Provider Care Teams.  These Care Teams include your primary Cardiologist (physician) and Advanced Practice Providers (APPs -  Physician Assistants and Nurse Practitioners) who all work together to provide you with the care you need, when you need it.  Your next appointment:   16 week(s) via MyChart  Provider:   Thomasene Ripple, DO   Other Instructions Please take your blood pressure daily for 2 weeks and send in a MyChart message. Please include heart rates. (One message at the end of the 2 weeks).   HOW TO TAKE YOUR BLOOD PRESSURE: Rest 5 minutes before taking your blood pressure. Don't smoke or drink caffeinated beverages for at least 30 minutes before. Take your blood pressure before (not after) you eat. Sit comfortably with your back supported and both feet on the floor (don't cross your legs). Elevate your arm to heart level on a table or a desk. Use the proper sized cuff. It should fit smoothly and snugly around your bare upper arm. There should be enough room to slip a fingertip under the cuff. The bottom edge of the cuff should be 1 inch above the crease  of the elbow. Ideally, take 3 measurements at one sitting and record the average.

## 2023-09-06 NOTE — Progress Notes (Unsigned)
 Cardiology Office Note:    Date:  09/06/2023   ID:  Gwendolyn Mitchell, DOB 08-04-1975, MRN [id number]  PCP:  Gwendolyn Brooks, MD  Cardiologist:  None  Electrophysiologist:  None   Referring MD: Gwendolyn Brooks, MD   No chief complaint on file. ***  History of Present Illness:    Gwendolyn Mitchell is a 48 y.o. female with a hx of ***   presents with a past diagnosis of mitral valve prolapse, which was identified years ago when she was much younger. She reports that it was not considered a major issue at the time. She is not currently on any medications. Her blood pressure was noted to be high during the visit, which she attributes to nervousness. She has a family history of heart disease, with her father having died of a heart attack eight years ago at the age of 24. Her mother is alive with no reported heart disease. Gwendolyn Mitchell has three children, and she did not have high blood pressure during her pregnancies. She has never smoked.  Past Medical History:  Diagnosis Date   Arnold-Chiari malformation (HCC)    Asthma    Hemangioma of liver - right lobe 12/17/2013   Migraines    Mitral valve prolapse 2012   Mild     Past Surgical History:  Procedure Laterality Date   BREAST BIOPSY Right 05/28/2020    Current Medications: Current Meds  Medication Sig   Multiple Vitamin (MULTIVITAMIN ADULT PO) Take by mouth.     Allergies:   Patient has no known allergies.   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    Employer: [employer]  Tobacco Use   Smoking status: Never   Smokeless tobacco: Never  Substance and Sexual Activity   Alcohol use: No   Drug use: No   Sexual activity: Yes    Birth control/protection: Condom  Other Topics Concern   Not on file  Social History Narrative   Not on file    Social Drivers of Health   Financial Resource Strain: Not on file  Food Insecurity: Not on file  Transportation Needs: Not on file  Physical Activity: Not on file  Stress: Not on file  Social Connections: Not on file     Family History: The patient's family history includes Breast cancer in her maternal grandmother and paternal grandmother; Diabetes in her brother and father; Heart disease in her father; Hyperlipidemia in her brother and father; Hypertension in her brother and father; Rheum arthritis in her brother; Skin cancer in her father.  ROS:   Review of Systems  Constitution: Negative for decreased appetite, fever and weight gain.  HENT: Negative for congestion, ear discharge, hoarse voice and sore throat.   Eyes: Negative for discharge, redness, vision loss in right eye and visual halos.  Cardiovascular: Negative for chest pain, dyspnea on exertion, leg swelling, orthopnea and palpitations.  Respiratory: Negative for cough, hemoptysis, shortness of breath and snoring.   Endocrine: Negative for heat intolerance and polyphagia.  Hematologic/Lymphatic: Negative for bleeding problem. Does not bruise/bleed easily.  Skin: Negative for flushing, nail changes, rash and suspicious lesions.  Musculoskeletal: Negative for arthritis, joint pain, muscle cramps, myalgias, neck pain and stiffness.  Gastrointestinal: Negative for abdominal pain, bowel  incontinence, diarrhea and excessive appetite.  Genitourinary: Negative for decreased libido, genital sores and incomplete emptying.  Neurological: Negative for brief paralysis, focal weakness, headaches and loss of balance.  Psychiatric/Behavioral: Negative for altered mental status, depression and suicidal ideas.  Allergic/Immunologic: Negative for HIV exposure and persistent infections.    EKGs/Labs/Other Studies Reviewed:    The following studies were reviewed today:   EKG:  The ekg ordered today demonstrates   Recent Labs: No  results found for requested labs within last 365 days.  Recent Lipid Panel    Component Value Date/Time   CHOL 158 03/05/2007 2143   TRIG 140 03/05/2007 2143   HDL 40 03/05/2007 2143   CHOLHDL 4.0 Ratio 03/05/2007 2143   VLDL 28 03/05/2007 2143   LDLCALC 90 03/05/2007 2143    Physical Exam:    VS:  BP (!) 142/78   Pulse 93   Ht 5\' 10"  (1.778 m)   Wt 169 lb 12.8 oz (77 kg)   SpO2 93%   BMI 24.36 kg/m     Wt Readings from Last 3 Encounters:  09/05/23 169 lb 12.8 oz (77 kg)  03/14/16 165 lb (74.8 kg)  05/29/15 166 lb (75.3 kg)     GEN: Well nourished, well developed in no acute distress HEENT: Normal NECK: No JVD; No carotid bruits LYMPHATICS: No lymphadenopathy CARDIAC: S1S2 noted,RRR, no murmurs, rubs, gallops RESPIRATORY:  Clear to auscultation without rales, wheezing or rhonchi  ABDOMEN: Soft, non-tender, non-distended, +bowel sounds, no guarding. EXTREMITIES: No edema, No cyanosis, no clubbing MUSCULOSKELETAL:  No deformity  SKIN: Warm and dry NEUROLOGIC:  Alert and oriented x 3, non-focal PSYCHIATRIC:  Normal affect, good insight  ASSESSMENT:    1. Encounter to establish care   2. ASCVD (arteriosclerotic cardiovascular disease)   3. Left atrial enlargement    PLAN:    Mitral Valve Prolapse EKG shows normal conduction, possible atrial dilatation likely due to breast tissue interference. Echocardiogram needed to confirm historical diagnosis and assess implications for life insurance and genetics. - Order echocardiogram to assess mitral valve and confirm historical diagnosis.  Hypertension Blood pressure 142/78 mmHg, possibly elevated due to anxiety. No hypertension history during pregnancies. Home monitoring needed to assess persistence. - Instruct her to monitor blood pressure daily for two weeks and upload results to MyChart. - Review home blood pressure readings after two weeks to determine if further intervention is needed. - Plan virtual follow-up if  home readings are normal; in-person follow-up if readings are elevated.  Family History of Coronary Artery Disease Father died of myocardial infarction at 41. Discussed coronary CT calcium score for plaque risk assessment. Not covered by insurance, costs $100. - Discuss coronary CT calcium score with her; inform that it is not covered by insurance and costs $100. - Schedule coronary CT calcium score if she agrees.  Hypertriglyceridemia Elevated triglycerides noted in 2021, recent lipid panel slightly elevated. Recheck in April to guide management. - Request her to upload April lipid panel results to MyChart for review.  Follow-up Follow-up based on home blood pressure monitoring and diagnostic tests. Virtual follow-up if blood pressure normal, in-person if elevated. - Schedule virtual follow-up in 16 weeks to discuss results of echocardiogram, coronary CT calcium score, and lipid panel. - Arrange in-person follow-up if home blood pressure readings are elevated and medication adjustment is needed.   The patient is in agreement with the above plan. The patient left the office in stable condition.  The patient will follow up in  Medication Adjustments/Labs and Tests Ordered: Current medicines are reviewed at length with the patient today.  Concerns regarding medicines are outlined above.  Orders Placed This Encounter  Procedures   CT CARDIAC SCORING (SELF PAY ONLY)   EKG 12-Lead   ECHOCARDIOGRAM COMPLETE   No orders of the defined types were placed in this encounter.   Patient Instructions  Medication Instructions:  Your physician recommends that you continue on your current medications as directed. Please refer to the Current Medication list given to you today.  *If you need a refill on your cardiac medications before your next appointment, please call your pharmacy*  Testing/Procedures: Your physician has requested that you have an echocardiogram. Echocardiography is a painless  test that uses sound waves to create images of your heart. It provides your doctor with information about the size and shape of your heart and how well your heart's chambers and valves are working. This procedure takes approximately one hour. There are no restrictions for this procedure. Please do NOT wear cologne, perfume, aftershave, or lotions (deodorant is allowed). Please arrive 15 minutes prior to your appointment time.  Please note: We ask at that you not bring children with you during ultrasound (echo/ vascular) testing. Due to room size and safety concerns, children are not allowed in the ultrasound rooms during exams. Our front office staff cannot provide observation of children in our lobby area while testing is being conducted. An adult accompanying a patient to their appointment will only be allowed in the ultrasound room at the discretion of the ultrasound technician under special circumstances. We apologize for any inconvenience.  Dr. Servando Salina has ordered a CT coronary calcium score.   Test locations:  [facility]  [facility] at [facility]  This is $99 out of pocket.   Coronary CalciumScan A coronary calcium scan is an imaging test used to look for deposits of calcium and other fatty materials (plaques) in the inner lining of the blood vessels of the heart (coronary arteries). These deposits of calcium and plaques can partly clog and narrow the coronary arteries without producing any symptoms or warning signs. This puts a person at risk for a heart attack. This test can detect these deposits before symptoms develop. Tell a health care provider about: Any allergies you have. All medicines you are taking, including vitamins, herbs, eye drops, creams, and over-the-counter medicines. Any problems you or family members have had with anesthetic medicines. Any blood disorders you have. Any surgeries you have  had. Any medical conditions you have. Whether you are pregnant or may be pregnant. What are the risks? Generally, this is a safe procedure. However, problems may occur, including: Harm to a pregnant woman and her unborn baby. This test involves the use of radiation. Radiation exposure can be dangerous to a pregnant woman and her unborn baby. If you are pregnant, you generally should not have this procedure done. Slight increase in the risk of cancer. This is because of the radiation involved in the test. What happens before the procedure? No preparation is needed for this procedure. What happens during the procedure? You will undress and remove any jewelry around your neck or chest. You will put on a hospital gown. Sticky electrodes will be placed on your chest. The electrodes will be connected to an electrocardiogram (ECG) machine to record a tracing of the electrical activity of your heart. A CT scanner will take pictures of your heart. During this  time, you will be asked to lie still and hold your breath for 2-3 seconds while a picture of your heart is being taken. The procedure may vary among health care providers and hospitals. What happens after the procedure? You can get dressed. You can return to your normal activities. It is up to you to get the results of your test. Ask your health care provider, or the department that is doing the test, when your results will be ready. Summary A coronary calcium scan is an imaging test used to look for deposits of calcium and other fatty materials (plaques) in the inner lining of the blood vessels of the heart (coronary arteries). Generally, this is a safe procedure. Tell your health care provider if you are pregnant or may be pregnant. No preparation is needed for this procedure. A CT scanner will take pictures of your heart. You can return to your normal activities after the scan is done. This information is not intended to replace advice given  to you by your health care provider. Make sure you discuss any questions you have with your health care provider. Document Released: 11/26/2007 Document Revised: 04/18/2016 Document Reviewed: 04/18/2016 Elsevier Interactive Patient Education  2017 [employer].   Follow-Up: At [facility], you and your health needs are our priority.  As part of our continuing mission to provide you with exceptional heart care, we have created designated Provider Care Teams.  These Care Teams include your primary Cardiologist (physician) and Advanced Practice Providers (APPs -  Physician Assistants and Nurse Practitioners) who all work together to provide you with the care you need, when you need it.  Your next appointment:   16 week(s) via MyChart  Provider:   Thomasene Ripple, DO   Other Instructions Please take your blood pressure daily for 2 weeks and send in a MyChart message. Please include heart rates. (One message at the end of the 2 weeks).   HOW TO TAKE YOUR BLOOD PRESSURE: Rest 5 minutes before taking your blood pressure. Don't smoke or drink caffeinated beverages for at least 30 minutes before. Take your blood pressure before (not after) you eat. Sit comfortably with your back supported and both feet on the floor (don't cross your legs). Elevate your arm to heart level on a table or a desk. Use the proper sized cuff. It should fit smoothly and snugly around your bare upper arm. There should be enough room to slip a fingertip under the cuff. The bottom edge of the cuff should be 1 inch above the crease of the elbow. Ideally, take 3 measurements at one sitting and record the average.    Adopting a Healthy Lifestyle.  Know what a healthy weight is for you (roughly BMI <25) and aim to maintain this   Aim for 7+ servings of fruits and vegetables daily   65-80+ fluid ounces of water or unsweet tea for healthy kidneys   Limit to max 1 drink of alcohol per day; avoid smoking/tobacco    Limit animal fats in diet for cholesterol and heart health - choose grass fed whenever available   Avoid highly processed foods, and foods high in saturated/trans fats   Aim for low stress - take time to unwind and care for your mental health   Aim for 150 min of moderate intensity exercise weekly for heart health, and weights twice weekly for bone health   Aim for 7-9 hours of sleep daily   When it comes to diets, agreement about the  perfect plan isnt easy to find, even among the experts. Experts at the [facility] of [employer] developed an idea known as the Healthy Eating Plate. Just imagine a plate divided into logical, healthy portions.   The emphasis is on diet quality:   Load up on vegetables and fruits - one-half of your plate: Aim for color and variety, and remember that potatoes dont count.   Go for whole grains - one-quarter of your plate: Whole wheat, barley, wheat berries, quinoa, oats, brown rice, and foods made with them. If you want pasta, go with whole wheat pasta.   Protein power - one-quarter of your plate: Fish, chicken, beans, and nuts are all healthy, versatile protein sources. Limit red meat.   The diet, however, does go beyond the plate, offering a few other suggestions.   Use healthy plant oils, such as olive, canola, soy, corn, sunflower and peanut. Check the labels, and avoid partially hydrogenated oil, which have unhealthy trans fats.   If youre thirsty, drink water. Coffee and tea are good in moderation, but skip sugary drinks and limit milk and dairy products to one or two daily servings.   The type of carbohydrate in the diet is more important than the amount. Some sources of carbohydrates, such as vegetables, fruits, whole grains, and beans-are healthier than others.   Finally, stay active  Signed, Thomasene Ripple, DO  09/06/2023 3:53 PM    [facility]

## 2023-09-08 ENCOUNTER — Ambulatory Visit (HOSPITAL_COMMUNITY)
Admission: RE | Admit: 2023-09-08 | Discharge: 2023-09-08 | Disposition: A | Discharge status: Home / Self Care | Payer: Self-pay | Source: Ambulatory Visit | Attending: Cardiology | Admitting: Cardiology

## 2023-09-08 DIAGNOSIS — I2510 Atherosclerotic heart disease of native coronary artery without angina pectoris: Secondary | ICD-10-CM | POA: Insufficient documentation

## 2023-09-12 ENCOUNTER — Encounter: Payer: Self-pay | Admitting: Cardiology

## 2023-09-21 ENCOUNTER — Ambulatory Visit (HOSPITAL_COMMUNITY): Attending: Cardiology

## 2023-09-21 DIAGNOSIS — I517 Cardiomegaly: Secondary | ICD-10-CM | POA: Diagnosis present

## 2023-09-21 LAB — ECHOCARDIOGRAM COMPLETE
Area-P 1/2: 3.23 cm2
S' Lateral: 2.6 cm

## 2023-09-23 ENCOUNTER — Encounter: Payer: Self-pay | Admitting: Cardiology

## 2023-10-05 ENCOUNTER — Other Ambulatory Visit (HOSPITAL_BASED_OUTPATIENT_CLINIC_OR_DEPARTMENT_OTHER)

## 2023-10-16 ENCOUNTER — Other Ambulatory Visit (HOSPITAL_COMMUNITY)

## 2023-12-26 ENCOUNTER — Ambulatory Visit: Admitting: Cardiology
